# Patient Record
Sex: Female | Born: 1935 | Race: Black or African American | Hispanic: No | Smoking: Former smoker
Health system: Southern US, Community
[De-identification: ages and names within clinical notes are randomized; demographics above are authoritative.]

## PROBLEM LIST (undated history)

## (undated) DIAGNOSIS — K219 Gastro-esophageal reflux disease without esophagitis: Secondary | ICD-10-CM

## (undated) DIAGNOSIS — K5790 Diverticulosis of intestine, part unspecified, without perforation or abscess without bleeding: Secondary | ICD-10-CM

## (undated) DIAGNOSIS — F0390 Unspecified dementia without behavioral disturbance: Secondary | ICD-10-CM

## (undated) DIAGNOSIS — K2970 Gastritis, unspecified, without bleeding: Secondary | ICD-10-CM

## (undated) DIAGNOSIS — K635 Polyp of colon: Secondary | ICD-10-CM

## (undated) DIAGNOSIS — I509 Heart failure, unspecified: Secondary | ICD-10-CM

## (undated) DIAGNOSIS — I10 Essential (primary) hypertension: Secondary | ICD-10-CM

## (undated) DIAGNOSIS — I2510 Atherosclerotic heart disease of native coronary artery without angina pectoris: Secondary | ICD-10-CM

## (undated) DIAGNOSIS — E039 Hypothyroidism, unspecified: Secondary | ICD-10-CM

## (undated) DIAGNOSIS — E785 Hyperlipidemia, unspecified: Secondary | ICD-10-CM

## (undated) DIAGNOSIS — E538 Deficiency of other specified B group vitamins: Secondary | ICD-10-CM

## (undated) DIAGNOSIS — K3184 Gastroparesis: Secondary | ICD-10-CM

## (undated) HISTORY — DX: Gastritis, unspecified, without bleeding: K29.70

## (undated) HISTORY — PX: PACEMAKER PLACEMENT: SHX43

## (undated) HISTORY — DX: Diverticulosis of intestine, part unspecified, without perforation or abscess without bleeding: K57.90

## (undated) HISTORY — DX: Atherosclerotic heart disease of native coronary artery without angina pectoris: I25.10

## (undated) HISTORY — DX: Hyperlipidemia, unspecified: E78.5

## (undated) HISTORY — DX: Gastroparesis: K31.84

## (undated) HISTORY — DX: Unspecified dementia, unspecified severity, without behavioral disturbance, psychotic disturbance, mood disturbance, and anxiety: F03.90

## (undated) HISTORY — DX: Gastro-esophageal reflux disease without esophagitis: K21.9

## (undated) HISTORY — DX: Polyp of colon: K63.5

## (undated) HISTORY — DX: Deficiency of other specified B group vitamins: E53.8

## (undated) HISTORY — DX: Hypothyroidism, unspecified: E03.9

## (undated) HISTORY — PX: HIP SURGERY: SHX245

---

## 1998-09-16 ENCOUNTER — Inpatient Hospital Stay (HOSPITAL_COMMUNITY): Admission: EM | Admit: 1998-09-16 | Discharge: 1998-09-18 | Payer: Self-pay | Admitting: Emergency Medicine

## 1998-09-16 ENCOUNTER — Encounter: Payer: Self-pay | Admitting: Emergency Medicine

## 1999-09-30 ENCOUNTER — Ambulatory Visit (HOSPITAL_COMMUNITY): Admission: RE | Admit: 1999-09-30 | Discharge: 1999-09-30 | Payer: Self-pay | Admitting: *Deleted

## 1999-11-22 ENCOUNTER — Other Ambulatory Visit: Admission: RE | Admit: 1999-11-22 | Discharge: 1999-11-22 | Payer: Self-pay | Admitting: *Deleted

## 2000-01-26 ENCOUNTER — Encounter: Payer: Self-pay | Admitting: Cardiology

## 2000-01-26 ENCOUNTER — Ambulatory Visit (HOSPITAL_COMMUNITY): Admission: RE | Admit: 2000-01-26 | Discharge: 2000-01-26 | Payer: Self-pay | Admitting: Cardiology

## 2000-02-03 ENCOUNTER — Emergency Department (HOSPITAL_COMMUNITY): Admission: EM | Admit: 2000-02-03 | Discharge: 2000-02-03 | Payer: Self-pay | Admitting: Emergency Medicine

## 2000-02-03 ENCOUNTER — Encounter: Payer: Self-pay | Admitting: Emergency Medicine

## 2000-02-26 ENCOUNTER — Emergency Department (HOSPITAL_COMMUNITY): Admission: EM | Admit: 2000-02-26 | Discharge: 2000-02-26 | Payer: Self-pay | Admitting: Emergency Medicine

## 2000-02-26 ENCOUNTER — Encounter: Payer: Self-pay | Admitting: Emergency Medicine

## 2000-03-13 ENCOUNTER — Emergency Department (HOSPITAL_COMMUNITY): Admission: EM | Admit: 2000-03-13 | Discharge: 2000-03-13 | Payer: Self-pay | Admitting: Emergency Medicine

## 2000-03-13 ENCOUNTER — Encounter: Payer: Self-pay | Admitting: Emergency Medicine

## 2000-06-12 ENCOUNTER — Encounter: Admission: RE | Admit: 2000-06-12 | Discharge: 2000-09-10 | Payer: Self-pay | Admitting: *Deleted

## 2001-06-13 ENCOUNTER — Encounter: Payer: Self-pay | Admitting: Family Medicine

## 2001-06-13 ENCOUNTER — Ambulatory Visit (HOSPITAL_COMMUNITY): Admission: RE | Admit: 2001-06-13 | Discharge: 2001-06-13 | Payer: Self-pay | Admitting: Family Medicine

## 2002-02-18 ENCOUNTER — Encounter: Payer: Self-pay | Admitting: Internal Medicine

## 2002-02-18 ENCOUNTER — Ambulatory Visit (HOSPITAL_COMMUNITY): Admission: RE | Admit: 2002-02-18 | Discharge: 2002-02-18 | Payer: Self-pay | Admitting: Internal Medicine

## 2002-04-08 ENCOUNTER — Encounter: Payer: Self-pay | Admitting: Internal Medicine

## 2002-04-08 ENCOUNTER — Ambulatory Visit (HOSPITAL_COMMUNITY): Admission: RE | Admit: 2002-04-08 | Discharge: 2002-04-08 | Payer: Self-pay | Admitting: Internal Medicine

## 2002-07-24 ENCOUNTER — Encounter (INDEPENDENT_AMBULATORY_CARE_PROVIDER_SITE_OTHER): Payer: Self-pay | Admitting: Cardiology

## 2002-07-24 ENCOUNTER — Inpatient Hospital Stay (HOSPITAL_COMMUNITY): Admission: EM | Admit: 2002-07-24 | Discharge: 2002-07-27 | Payer: Self-pay | Admitting: Emergency Medicine

## 2002-07-24 ENCOUNTER — Encounter: Payer: Self-pay | Admitting: Cardiology

## 2002-09-17 ENCOUNTER — Encounter: Payer: Self-pay | Admitting: Cardiology

## 2002-09-17 ENCOUNTER — Encounter: Admission: RE | Admit: 2002-09-17 | Discharge: 2002-09-17 | Payer: Self-pay | Admitting: Cardiology

## 2002-09-23 ENCOUNTER — Ambulatory Visit (HOSPITAL_COMMUNITY): Admission: RE | Admit: 2002-09-23 | Discharge: 2002-09-24 | Payer: Self-pay | Admitting: Cardiology

## 2003-05-07 ENCOUNTER — Encounter: Payer: Self-pay | Admitting: Internal Medicine

## 2003-05-07 ENCOUNTER — Ambulatory Visit (HOSPITAL_COMMUNITY): Admission: RE | Admit: 2003-05-07 | Discharge: 2003-05-07 | Payer: Self-pay | Admitting: Internal Medicine

## 2003-05-21 ENCOUNTER — Ambulatory Visit: Admission: RE | Admit: 2003-05-21 | Discharge: 2003-05-21 | Payer: Self-pay | Admitting: Internal Medicine

## 2003-07-18 ENCOUNTER — Encounter: Payer: Self-pay | Admitting: Emergency Medicine

## 2003-07-18 ENCOUNTER — Inpatient Hospital Stay (HOSPITAL_COMMUNITY): Admission: EM | Admit: 2003-07-18 | Discharge: 2003-07-22 | Payer: Self-pay | Admitting: Emergency Medicine

## 2003-07-19 ENCOUNTER — Encounter: Payer: Self-pay | Admitting: Emergency Medicine

## 2003-07-20 ENCOUNTER — Encounter: Payer: Self-pay | Admitting: Internal Medicine

## 2003-07-20 ENCOUNTER — Encounter: Payer: Self-pay | Admitting: Cardiology

## 2004-04-10 ENCOUNTER — Emergency Department (HOSPITAL_COMMUNITY): Admission: EM | Admit: 2004-04-10 | Discharge: 2004-04-11 | Payer: Self-pay | Admitting: Emergency Medicine

## 2004-06-08 ENCOUNTER — Ambulatory Visit (HOSPITAL_COMMUNITY): Admission: RE | Admit: 2004-06-08 | Discharge: 2004-06-08 | Payer: Self-pay | Admitting: *Deleted

## 2004-06-08 ENCOUNTER — Encounter (INDEPENDENT_AMBULATORY_CARE_PROVIDER_SITE_OTHER): Payer: Self-pay | Admitting: Specialist

## 2004-06-08 ENCOUNTER — Encounter (INDEPENDENT_AMBULATORY_CARE_PROVIDER_SITE_OTHER): Payer: Self-pay | Admitting: *Deleted

## 2005-03-16 ENCOUNTER — Encounter: Admission: RE | Admit: 2005-03-16 | Discharge: 2005-03-16 | Payer: Self-pay | Admitting: Cardiology

## 2005-08-03 ENCOUNTER — Encounter (INDEPENDENT_AMBULATORY_CARE_PROVIDER_SITE_OTHER): Payer: Self-pay | Admitting: Specialist

## 2005-08-03 ENCOUNTER — Ambulatory Visit (HOSPITAL_COMMUNITY): Admission: RE | Admit: 2005-08-03 | Discharge: 2005-08-03 | Payer: Self-pay | Admitting: Gastroenterology

## 2005-08-08 ENCOUNTER — Inpatient Hospital Stay (HOSPITAL_COMMUNITY): Admission: EM | Admit: 2005-08-08 | Discharge: 2005-08-18 | Payer: Self-pay | Admitting: Emergency Medicine

## 2005-08-17 ENCOUNTER — Encounter (INDEPENDENT_AMBULATORY_CARE_PROVIDER_SITE_OTHER): Payer: Self-pay | Admitting: Cardiology

## 2007-01-02 ENCOUNTER — Inpatient Hospital Stay (HOSPITAL_COMMUNITY): Admission: EM | Admit: 2007-01-02 | Discharge: 2007-01-05 | Payer: Self-pay | Admitting: Emergency Medicine

## 2008-01-09 ENCOUNTER — Ambulatory Visit (HOSPITAL_COMMUNITY): Admission: RE | Admit: 2008-01-09 | Discharge: 2008-01-09 | Payer: Self-pay | Admitting: Internal Medicine

## 2008-12-21 ENCOUNTER — Emergency Department (HOSPITAL_COMMUNITY): Admission: EM | Admit: 2008-12-21 | Discharge: 2008-12-21 | Payer: Self-pay | Admitting: Emergency Medicine

## 2009-03-16 ENCOUNTER — Inpatient Hospital Stay (HOSPITAL_COMMUNITY): Admission: AD | Admit: 2009-03-16 | Discharge: 2009-03-18 | Payer: Self-pay | Admitting: Cardiology

## 2009-03-17 ENCOUNTER — Encounter (INDEPENDENT_AMBULATORY_CARE_PROVIDER_SITE_OTHER): Payer: Self-pay | Admitting: Cardiology

## 2009-03-21 ENCOUNTER — Inpatient Hospital Stay (HOSPITAL_COMMUNITY): Admission: EM | Admit: 2009-03-21 | Discharge: 2009-03-24 | Payer: Self-pay | Admitting: Emergency Medicine

## 2009-09-06 ENCOUNTER — Observation Stay (HOSPITAL_COMMUNITY): Admission: EM | Admit: 2009-09-06 | Discharge: 2009-09-08 | Payer: Self-pay | Admitting: Emergency Medicine

## 2009-09-06 ENCOUNTER — Ambulatory Visit: Payer: Self-pay | Admitting: Infectious Diseases

## 2009-09-21 ENCOUNTER — Encounter: Admission: RE | Admit: 2009-09-21 | Discharge: 2009-09-21 | Payer: Self-pay | Admitting: Internal Medicine

## 2009-09-25 ENCOUNTER — Observation Stay (HOSPITAL_COMMUNITY): Admission: EM | Admit: 2009-09-25 | Discharge: 2009-09-25 | Payer: Self-pay | Admitting: Emergency Medicine

## 2009-11-16 ENCOUNTER — Observation Stay (HOSPITAL_COMMUNITY): Admission: EM | Admit: 2009-11-16 | Discharge: 2009-11-25 | Payer: Self-pay | Admitting: Emergency Medicine

## 2009-12-25 DIAGNOSIS — K635 Polyp of colon: Secondary | ICD-10-CM

## 2009-12-25 DIAGNOSIS — K2970 Gastritis, unspecified, without bleeding: Secondary | ICD-10-CM

## 2009-12-25 DIAGNOSIS — K5790 Diverticulosis of intestine, part unspecified, without perforation or abscess without bleeding: Secondary | ICD-10-CM

## 2009-12-25 HISTORY — DX: Polyp of colon: K63.5

## 2009-12-25 HISTORY — DX: Gastritis, unspecified, without bleeding: K29.70

## 2009-12-25 HISTORY — DX: Diverticulosis of intestine, part unspecified, without perforation or abscess without bleeding: K57.90

## 2010-01-27 ENCOUNTER — Encounter: Payer: Self-pay | Admitting: Gastroenterology

## 2010-01-28 ENCOUNTER — Encounter (INDEPENDENT_AMBULATORY_CARE_PROVIDER_SITE_OTHER): Payer: Self-pay | Admitting: *Deleted

## 2010-03-01 ENCOUNTER — Ambulatory Visit: Payer: Self-pay | Admitting: Gastroenterology

## 2010-03-01 DIAGNOSIS — Z860100 Personal history of colon polyps, unspecified: Secondary | ICD-10-CM | POA: Insufficient documentation

## 2010-03-01 DIAGNOSIS — E119 Type 2 diabetes mellitus without complications: Secondary | ICD-10-CM | POA: Insufficient documentation

## 2010-03-01 DIAGNOSIS — R1013 Epigastric pain: Secondary | ICD-10-CM

## 2010-03-01 DIAGNOSIS — I2510 Atherosclerotic heart disease of native coronary artery without angina pectoris: Secondary | ICD-10-CM | POA: Insufficient documentation

## 2010-03-07 ENCOUNTER — Ambulatory Visit: Payer: Self-pay | Admitting: Gastroenterology

## 2010-03-08 ENCOUNTER — Telehealth: Payer: Self-pay | Admitting: Gastroenterology

## 2010-03-09 ENCOUNTER — Encounter: Payer: Self-pay | Admitting: Gastroenterology

## 2010-03-17 ENCOUNTER — Ambulatory Visit (HOSPITAL_COMMUNITY): Admission: RE | Admit: 2010-03-17 | Discharge: 2010-03-17 | Payer: Self-pay | Admitting: Gastroenterology

## 2010-03-30 ENCOUNTER — Encounter: Payer: Self-pay | Admitting: Internal Medicine

## 2010-03-30 ENCOUNTER — Ambulatory Visit: Payer: Self-pay | Admitting: Gastroenterology

## 2010-03-30 DIAGNOSIS — K3184 Gastroparesis: Secondary | ICD-10-CM

## 2010-03-30 DIAGNOSIS — E538 Deficiency of other specified B group vitamins: Secondary | ICD-10-CM

## 2010-04-08 ENCOUNTER — Ambulatory Visit: Payer: Self-pay | Admitting: Gastroenterology

## 2010-04-18 ENCOUNTER — Encounter: Payer: Self-pay | Admitting: Gastroenterology

## 2010-08-31 ENCOUNTER — Ambulatory Visit (HOSPITAL_COMMUNITY): Admission: RE | Admit: 2010-08-31 | Discharge: 2010-08-31 | Payer: Self-pay | Admitting: Internal Medicine

## 2010-10-03 ENCOUNTER — Emergency Department (HOSPITAL_COMMUNITY): Admission: EM | Admit: 2010-10-03 | Discharge: 2010-10-03 | Payer: Self-pay | Admitting: Emergency Medicine

## 2011-01-26 NOTE — Letter (Signed)
Summary: New Patient letter  [facility]  [address] [place], [place] [address]   Phone: [phone]  Fax: [phone]       01/28/2010 MRN: [id number]  [facility] [address] [place], [place]  [address]  Dear Ms. Chill,  Welcome to the Gastroenterology Division at [facility].    You are scheduled to see Dr.  Melvia Heaps on February 21, 2010 at 10:45am on the 3rd floor at [facility], [address]  We ask that you try to arrive at our office 15 minutes prior to your appointment time to allow for check-in.  We would like you to complete the enclosed self-administered evaluation form prior to your visit and bring it with you on the day of your appointment.  We will review it with you.  Also, please bring a complete list of all your medications or, if you prefer, bring the medication bottles and we will list them.  Please bring your insurance card so that we may make a copy of it.  If your insurance requires a referral to see a specialist, please bring your referral form from your primary care physician.  Co-payments are due at the time of your visit and may be paid by cash, check or credit card.     Your office visit will consist of a consult with your physician (includes a physical exam), any laboratory testing he/she may order, scheduling of any necessary diagnostic testing (e.g. x-ray, ultrasound, CT-scan), and scheduling of a procedure (e.g. Endoscopy, Colonoscopy) if required.  Please allow enough time on your schedule to allow for any/all of these possibilities.    If you cannot keep your appointment, please call [phone] to cancel or reschedule prior to your appointment date.  This allows [place] the opportunity to schedule an appointment for another patient in need of care.  If you do not cancel or reschedule by 5 p.m. the business day prior to your appointment date, you will be charged a $50.00 late cancellation/no-show fee.     Thank you for choosing [facility] for your medical needs.  We appreciate the opportunity to care for you.  Please visit [place] at our website  to learn more about our practice.                     Sincerely,                                                             The Gastroenterology Division

## 2011-01-26 NOTE — Procedures (Signed)
Summary: colonoscopy Roosvelt Harps)   Colonoscopy  Procedure date:  06/08/2004  Findings:      Results: Polyp.  Location:  [facility].    Colonoscopy  Procedure date:  06/08/2004  Findings:      Results: Polyp.  Location:  [facility].   Patient Name: [name], [name] MRN:  Procedure Procedures: Colonoscopy CPT: [phone].    with biopsy. CPT: Q5068410.  Personnel: Endoscopist: Roosvelt Harps, MD.  Referred By: Fleet Contras, MD.  Exam Location: Exam performed in Endoscopy Suite. Outpatient  Patient Consent: Procedure, Alternatives, Risks and Benefits discussed, consent obtained, from patient. Consent was obtained by the RN. Consent to be contacted was not given.  Indications Symptoms: Constipation Melena (unknown source). Abdominal pain / bloating.  Average Risk Screening Routine.  History  Current Medications: Patient is not currently taking Coumadin.  Allergies: Allergic to PCN.  Patient Habits Patient does not smoke. Drinking Status: not currently drinking.  Pre-Exam Physical: Cardio-pulmonary exam, Rectal exam, HEENT exam , Abdominal exam, Extremity exam, Neurological exam, Mental status exam WNL.  Exam Exam: Extent of exam reached: Cecum, extent intended: Cecum.  The cecum was identified by appendiceal orifice and IC valve. Patient position: on left side. Images taken. ASA Classification: II. Tolerance: excellent.  Monitoring: Pulse and BP monitoring, Oximetry used. Supplemental O2 given.  Colon Prep Used Phospho Soda for colon prep. Prep results: excellent.  Fluoroscopy: Fluoroscopy was not used.  Sedation Meds: Patient assessed and found to be appropriate for moderate (conscious) sedation. Sedation was managed by the Endoscopist. Residual sedation present from prior procedure today.  Demerol 10 mg. given IV. Versed 1 mg. given IV.  Findings IMAGE TAKEN: Transverse Colon.  Image #2 attached.  Comments:  Normal.  IMAGE TAKEN:  Descending Colon.  Image #3 attached.  Comments:  Normal.  IMAGE TAKEN: Ascending Colon.  Image #1 attached.  Comments:  Normal.  NORMAL EXAM: Cecum.  POLYP: Rectum, diminutive, Distance from Anus 2 cm. Procedure:  biopsy without cautery, The polyp was removed piece meal. removed, retrieved, Polyp sent to pathology. ICD9: Colon Polyps: 211.3. Comments: Image # 4.   Assessment Abnormal examination, see findings above.  Diagnoses: 211.3: Colon Polyps.   Events  Unplanned Interventions: No intervention was required.  Unplanned Events: There were no complications. Plans  Post Exam Instructions: Post sedation instructions given.  Medication Plan: Continue current medications.  Patient Education: Patient given standard instructions for: Polyps.  Disposition: After procedure patient sent to recovery. After recovery patient sent home.  Scheduling/Referral: Follow-Up prn. Await pathology to schedule patient.   This report was created from the original endoscopy report, which was reviewed and signed by the above listed endoscopist.

## 2011-01-26 NOTE — Assessment & Plan Note (Signed)
Summary: nausea, abd pain...em   History of Present Illness Visit Type: Initial Consult Primary GI MD: Melvia Heaps MD [facility] Primary Provider: Marisue Brooklyn, DO Chief Complaint: Patient c/o generalized abdominal discomfort since she had a fall at Thanksgiving. She also c/o chest discomfort as well as nausea, moreso in the mornings. She notes that she has seen some brb when she "spits up." Patient has nausea but denies any actual vomiting. She has had some loss of appetite and initially had a weight loss of 20 pounds, although she is now back up to 180 pounds. History of Present Illness:   [name] is an 77 year old African American female with a history of coronary artery disease, chronic kidney disease, hypertension and diabetes referred at the request of  Marisue Brooklyn, DO for evaluation of nausea and upper abdominal pain.  The patient is a very limited historian.  She complains of vague upper abdominal pain that occurs either during eating or immediately postprandially.  It is associated with nausea but no vomiting.  She takes Zantac regularly.  She is on no gastric irritants or nonsteroidals.  She may have mild dysphagia solids.  She denies pyrosis, per se.  She initially lost 20 pounds beginning September, 2010 but regained her weight.  She does claim that she occasionally spits up some blood.  In 2005 patient underwent colonoscopy which demonstrated a diminutive adenomatous rectal polyp.  The patient is on Plavix.   GI Review of Systems    Reports abdominal pain, acid reflux, chest pain, loss of appetite, nausea, and  weight loss.     Location of  Abdominal pain: generalized.    Denies belching, bloating, dysphagia with liquids, dysphagia with solids, heartburn, vomiting, vomiting blood, and  weight gain.      Reports constipation.     Denies anal fissure, black tarry stools, change in bowel habit, diarrhea, diverticulosis, fecal incontinence, heme positive stool, hemorrhoids, irritable  bowel syndrome, jaundice, light color stool, liver problems, rectal bleeding, and  rectal pain. Preventive Screening-Counseling & Management  Alcohol-Tobacco     Smoking Status: quit      Drug Use:  no.      Current Medications (verified): 1)  Aspirin 81 Mg Tbec (Aspirin) .Marland Kitchen.. 1 By Mouth Once Daily 2)  Plavix 75 Mg Tabs (Clopidogrel Bisulfate) .Marland Kitchen.. 1 By Mouth Once Daily 3)  Famotidine 20 Mg Tabs (Famotidine) .Marland Kitchen.. 1 By Mouth Two Times A Day 4)  Lasix 40 Mg Tabs (Furosemide) .Marland Kitchen.. 1 By Mouth Two Times A Day 5)  Synthroid 125 Mcg Tabs (Levothyroxine Sodium) .Marland Kitchen.. 1 By Mouth Once Daily 6)  Simvastatin 20 Mg Tabs (Simvastatin) .Marland Kitchen.. 1 By Mouth Once Daily 7)  Detrol La 4 Mg Xr24h-Cap (Tolterodine Tartrate) .Marland Kitchen.. 1 By Mouth Once Daily 8)  Zantac 150 Mg Tabs (Ranitidine Hcl) .Marland Kitchen.. 1 By Mouth Two Times A Day 9)  Atenolol 50 Mg Tabs (Atenolol) .Marland Kitchen.. 1 By Mouth Once Daily 10)  Vitamin D 2000 Unit Tabs (Cholecalciferol) .Marland Kitchen.. 1 By Mouth Once Daily 11)  Zantac 150 Mg Tabs (Ranitidine Hcl) .Marland Kitchen.. 1 By Mouth Once Daily 12)  Glipizide 5 Mg Tabs (Glipizide) .Marland Kitchen.. 1 By Mouth Two Times A Day 13)  Diltiazem Hcl Coated Beads 360 Mg Xr24h-Tab (Diltiazem Hcl Coated Beads) .Marland Kitchen.. 1 By Mouth Once Daily 14)  Multivitamins  Tabs (Multiple Vitamin) .... Take 2 Tablets By Mouth Once Daily  Allergies (verified): 1)  ! Pcn  Past History:  Past Medical History: Reviewed history from 02/24/2010 and  no changes required. Chronic Kidney Disease Hypothyroidism Hypertension Diabetes Hyperlipidemia GERD hx of heart blockage osteoarthritis urinary incontinence  Past Surgical History: pacemaker placement  Family History: No FH of Colon Cancer: Family History of Diabetes: Brothers x 2, Sister Family History of Heart Disease: Father  Social History: Patient is a former smoker. -only smoked a few years...stopped at age 73 Alcohol Use - no Daily Caffeine Use-1-2 per week Illicit Drug Use - no Smoking Status:   quit Drug Use:  no  Review of Systems       The patient complains of headaches-new.  The patient denies allergy/sinus, anemia, anxiety-new, arthritis/joint pain, back pain, blood in urine, breast changes/lumps, change in vision, confusion, cough, coughing up blood, depression-new, fainting, fatigue, fever, hearing problems, heart murmur, heart rhythm changes, itching, menstrual pain, muscle pains/cramps, night sweats, nosebleeds, pregnancy symptoms, shortness of breath, skin rash, sleeping problems, sore throat, swelling of feet/legs, swollen lymph glands, thirst - excessive , urination - excessive , urination changes/pain, urine leakage, vision changes, and voice change.         All other systems were reviewed and were negative   Vital Signs:  Patient profile:   77 year old female Height:      71 inches Weight:      180 pounds BMI:     25.20 BSA:     2.02 Pulse rate:   60 / minute Pulse rhythm:   regular BP sitting:   136 / 80  (left arm)  Vitals Entered By: Hortense Ramal CMA Duncan Dull) (March 01, 2010 1:20 PM)  Physical Exam  Additional Exam:  She is an alert female in no acute distress  skin: anicteric HEENT: normocephalic; PEERLA; no nasal or pharyngeal abnormalities neck: supple nodes: no cervical lymphadenopathy chest: clear to ausculatation and percussion heart: no gallops or rubs; there is a 2/6 early systolic murmur heard best at the left sternal border abd: soft, nontender; BS normoactive; no abdominal masses,  organomegaly; there is mild tenderness to the patient in the upper midepigastrium and right upper quadrant in the subcostal area rectal: deferred ext: no cynanosis, clubbing; she has 4+ pedal edema edema skeletal: no deformities neuro: oriented x 3; no focal abnormalities    Impression & Recommendations:  Problem # 1:  ABDOMINAL PAIN, EPIGASTRIC (ICD-789.06)  Symptoms of pain and nausea it could be due to ulcer or nonulcer dyspepsia.  Gastroparesis is also a  consideration.  Recommendations #1 upper endoscopy #2 to consider gastric emptying scan pending results of #1  Risks, alternatives, and complications of the procedure, including bleeding, perforation, and possible need for surgery, were explained to the patient.  Patient's questions were answered.  Orders: EGD (EGD)  Problem # 2:  PERSONAL HISTORY OF COLONIC POLYPS (ICD-V12.72) Plan followup colonoscopy when patient's abdominal pain is resolved  Problem # 3:  CAD (ICD-414.00) Patient is on Plavix  Problem # 4:  DM (ICD-250.00) Assessment: Comment Only  Patient Instructions: 1)  Conscious Sedation brochure given.  2)  Upper Endoscopy brochure given.  3)  Your EGD is scheduled for 03/07/2010 at 2pm on the 4th floor of the [facility] building 4)  Stay on Plavix for your procedure 5)  CC Dr. Marisue Brooklyn 6)  The medication list was reviewed and reconciled.  All changed / newly prescribed medications were explained.  A complete medication list was provided to the patient / caregiver.

## 2011-01-26 NOTE — Letter (Signed)
Summary: Results Letter  [facility]  [address] [place], [place] [address]   Phone: [phone]  Fax: [phone]        March 09, 2010 MRN: [id number]    [facility] [address] [facility] [place], [place]  [address]    Dear Ms. Teagle,   Your biopsies demonstrated inflammatory changes only.    Please follow the recommendations previously discussed.  Should you have any immediate concerns or questions, feel free to contact me at the office.    Sincerely,  Barbette Hair. Arlyce Dice, [name], [facility]          Sincerely,  Louis Meckel MD  This letter has been electronically signed by your physician.  Appended Document: Results Letter Letter mailed 3.17.11

## 2011-01-26 NOTE — Letter (Signed)
Summary: [facility] Instructions  [facility]  [address] [place], [place] [address]   Phone: [phone]  Fax: [phone]       [name]    10/25/1925    MRN: [id number]        Procedure Day /Date:THURSDAY, 04/07/10     Arrival Time:7:30 AM     Procedure Time:8:30 AM     Location of Procedure:                    [facility] (4th Floor)   PREPARATION FOR COLONOSCOPY WITH MOVIPREP   Starting 5 days prior to your procedure 04/03/10 do not eat nuts, seeds, popcorn, corn, beans, peas,  salads, or any raw vegetables.  Do not take any fiber supplements (e.g. Metamucil, Citrucel, and Benefiber).  THE DAY BEFORE YOUR PROCEDURE         DATE: 04/06/10 DAY: WEDNESDAY  1.  Drink clear liquids the entire day-NO SOLID FOOD  2.  Do not drink anything colored red or purple.  Avoid juices with pulp.  No orange juice.  3.  Drink at least 64 oz. (8 glasses) of fluid/clear liquids during the day to prevent dehydration and help the prep work efficiently.  CLEAR LIQUIDS INCLUDE: Water Jello Ice Popsicles Tea (sugar ok, no milk/cream) Powdered fruit flavored drinks Coffee (sugar ok, no milk/cream) Gatorade Juice: apple, white grape, white cranberry  Lemonade Clear bullion, consomm, broth Carbonated beverages (any kind) Strained chicken noodle soup Hard Candy                             4.  In the morning, mix first dose of MoviPrep solution:    Empty 1 Pouch A and 1 Pouch B into the disposable container    Add lukewarm drinking water to the top line of the container. Mix to dissolve    Refrigerate (mixed solution should be used within 24 hrs)  5.  Begin drinking the prep at 5:00 p.m. The MoviPrep container is divided by 4 marks.   Every 15 minutes drink the solution down to the next mark (approximately 8 oz) until the full liter is complete.   6.  Follow completed prep with 16 oz of clear liquid of your choice (Nothing red or purple).   Continue to drink clear liquids until bedtime.  7.  Before going to bed, mix second dose of MoviPrep solution:    Empty 1 Pouch A and 1 Pouch B into the disposable container    Add lukewarm drinking water to the top line of the container. Mix to dissolve    Refrigerate  THE DAY OF YOUR PROCEDURE      DATE: 04/07/10 DAY: THURSDAY  Beginning at 3:30 a.m. (5 hours before procedure):         1. Every 15 minutes, drink the solution down to the next mark (approx 8 oz) until the full liter is complete.  2. Follow completed prep with 16 oz. of clear liquid of your choice.    3. You may drink clear liquids until6:30 AM (2 HOURS BEFORE PROCEDURE).   MEDICATION INSTRUCTIONS  Unless otherwise instructed, you should take regular prescription medications with a small sip of water   as early as possible the morning of your procedure.  Diabetic patients - see separate instructions.  Stop taking Plavix  ON 03/31/10 (7 days before procedure).  OTHER INSTRUCTIONS  You will need a responsible adult at least 75 years of age to accompany you and drive you home.   This person must remain in the waiting room during your procedure.  Wear loose fitting clothing that is easily removed.  Leave jewelry and other valuables at home.  However, you may wish to bring a book to read or  an iPod/MP3 player to listen to music as you wait for your procedure to start.  Remove all body piercing jewelry and leave at home.  Total time from sign-in until discharge is approximately 2-3 hours.  You should go home directly after your procedure and rest.  You can resume normal activities the  day after your procedure.  The day of your procedure you should not:   Drive   Make legal decisions   Operate machinery   Drink alcohol   Return to work  You will receive specific instructions about eating, activities and medications before you leave.    The above instructions have been reviewed and  explained to me by   _______________________    I fully understand and can verbalize these instructions _____________________________ Date _________  Appended Document: Moviprep Instructions Pt.'s daughter called and changed appt. to 04/08/10 at 3:30 pm.  She was given the new times and dates with instructions for 04/08/10.  She understands and will call if any further questions.

## 2011-01-26 NOTE — Letter (Signed)
Summary: EGD Instructions  [facility]  [address] [place], [place] [address]   Phone: [phone]  Fax: [phone]       VANA ARIF    Dec 15, 1925    MRN: [id number]       Procedure Day /Date:MONDAY 03/07/2010     Arrival Time:1PM     Procedure Time:2PM     Location of Procedure:                    X  [facility] (4th Floor)  PREPARATION FOR ENDOSCOPY   On 03/07/2010  THE DAY OF THE PROCEDURE:  1.   No solid foods, milk or milk products are allowed after midnight the night before your procedure.  2.   Do not drink anything colored red or purple.  Avoid juices with pulp.  No orange juice.  3.  You may drink clear liquids until 12PM  which is 2 hours before your procedure.                                                                                                CLEAR LIQUIDS INCLUDE: Water Jello Ice Popsicles Tea (sugar ok, no milk/cream) Powdered fruit flavored drinks Coffee (sugar ok, no milk/cream) Gatorade Juice: apple, white grape, white cranberry  Lemonade Clear bullion, consomm, broth Carbonated beverages (any kind) Strained chicken noodle soup Hard Candy   MEDICATION INSTRUCTIONS  Unless otherwise instructed, you should take regular prescription medications with a small sip of water as early as possible the morning of your procedure.  Diabetic patients - see separate instructions.  Stop taking Plavix    STAY ON PLAVIX PER DR Sabrine Patchen            OTHER INSTRUCTIONS  You will need a responsible adult at least 75 years of age to accompany you and drive you home.   This person must remain in the waiting room during your procedure.  Wear loose fitting clothing that is easily removed.  Leave jewelry and other valuables at home.  However, you may wish to bring a book to read or an iPod/MP3 player to listen to music as you wait for your procedure to start.  Remove all body piercing jewelry and leave at  home.  Total time from sign-in until discharge is approximately 2-3 hours.  You should go home directly after your procedure and rest.  You can resume normal activities the day after your procedure.  The day of your procedure you should not:   Drive   Make legal decisions   Operate machinery   Drink alcohol   Return to work  You will receive specific instructions about eating, activities and medications before you leave.    The above instructions have been reviewed and explained to me by   _______________________    I fully understand and can verbalize these instructions _____________________________ Date _________

## 2011-01-26 NOTE — Letter (Signed)
Summary: Diabetic Instructions  [facility]  [address] [place], [place] [address]   Phone: [phone]  Fax: [phone]    [name] 05/20/1925 MRN: [id number]   X  ORAL DIABETIC MEDICATION INSTRUCTIONS  The day before your procedure:   Take your diabetic pill as you do normally  The day of your procedure:   Do not take your diabetic pill    We will check your blood sugar levels during the admission process and again in Recovery before discharging you home  ________________________________________________________________________  _  _   INSULIN (LONG ACTING) MEDICATION INSTRUCTIONS (Lantus, NPH, 70/30, Humulin, Novolin-N)   The day before your procedure:   Take  your regular evening dose    The day of your procedure:   Do not take your morning dose    _  _   INSULIN (SHORT ACTING) MEDICATION INSTRUCTIONS (Regular, Humulog, Novolog)   The day before your procedure:   Do not take your evening dose   The day of your procedure:   Do not take your morning dose   _  _   INSULIN PUMP MEDICATION INSTRUCTIONS  We will contact the physician managing your diabetic care for written dosage instructions for the day before your procedure and the day of your procedure.  Once we have received the instructions, we will contact you.

## 2011-01-26 NOTE — Procedures (Signed)
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    with biopsy(s)/brushing(s). CPT: D1846139.  Personnel: Endoscopist: Roosvelt Harps, MD.  Referred By: Fleet Contras, MD.  Exam Location: Exam performed in Endoscopy Suite. Outpatient  Patient Consent: Procedure, Alternatives, Risks and Benefits discussed, consent obtained, from patient. Consent was obtained by the RN. Consent to be contacted was not given.  Indications Symptoms: Dysphagia. Abdominal pain, location: diffuse. Melena.  History  Current Medications: Patient is not currently taking Coumadin.  Allergies: Patient is allergic to PCN.  Patient Habits Patient does not smoke. Drinking Status: not currently drinking.  Pre-Exam Physical: Cardio-pulmonary exam, HEENT exam, Abdominal exam, Extremity exam, Neurological exam, Mental status exam WNL.  Exam Exam Info: Maximum depth of insertion Stomach, intended Duodenum. Patient position: left side to back. Vocal cords not visualized. Gastric retroflexion performed. Images taken. ASA Classification: II. Tolerance: good.  Sedation Meds: Patient assessed and found to be appropriate for moderate (conscious) sedation. Sedation was managed by the Endoscopist. Cetacaine Spray 2 sprays given aerosolized. Demerol 50 mg. given IV. Versed 5 mg. given IV.  Monitoring: BP and pulse monitoring done. Oximetry used. Supplemental O2 given  Fluoroscopy: Fluoroscopy was not used.  Findings - Normal: Mid Esophagus to Distal Esophagus. Not Seen: Stricture.  IMAGE TAKEN: in Cardia. Image #4 attached. Comments: Normal.  IMAGE TAKEN: in Duodenal Bulb. Image #2 attached. Comments:  Normal.  POLYP: in Antrum. Maximum size: 6 mm. sessile polyp. Procedure: biopsy without cautery, Polyp sent to pathology. ICD9: Neoplasia, Benign, Stomach: 211.1. Comments: Image # 1.  DIAGNOSTIC TEST: from Antrum. RUT done, results pending  IMAGE TAKEN: in Duodenal 2nd Portion. Image #3 attached. Comments: Normal.   Assessment Abnormal examination, see findings above.  Diagnoses: 211.1: Neoplasia, Benign, Stomach.   Events  Unplanned Intervention: No unplanned interventions were required.  Unplanned Events: There were no complications. Plans Medication(s): Continue current medications.  Disposition: After procedure patient sent to recovery. After recovery patient sent home.  Scheduling: Await pathology to schedule patient. Follow-up prn.  This report was created from the original endoscopy report, which was reviewed and signed by the above listed endoscopist.

## 2011-01-26 NOTE — Procedures (Signed)
Summary: Upper Endoscopy  Patient: Nazia Rhines Note: All result statuses are Final unless otherwise noted.  Tests: (1) Upper Endoscopy (EGD)   EGD Upper Endoscopy       DONE     [facility]     [address]     [place], [place]  [address]           ENDOSCOPY PROCEDURE REPORT           PATIENT:  Britany, Callicott  MR#:  [id number]     BIRTHDATE:  [birth year], 84 yrs. old  GENDER:  female           ENDOSCOPIST:  Barbette Hair. Arlyce Dice, MD     Referred by:           PROCEDURE DATE:  03/07/2010     PROCEDURE:  EGD with biopsy     ASA CLASS:  Class III     INDICATIONS:  abdominal pain           MEDICATIONS:   Fentanyl 25 mcg IV, Versed 4 mg IV, glycopyrrolate     (Robinal) 0.2 mg IV, 0.6cc simethancone 0.6 cc PO     TOPICAL ANESTHETIC:  Exactacain Spray           DESCRIPTION OF PROCEDURE:   After the risks benefits and     alternatives of the procedure were thoroughly explained, informed     consent was obtained.  The LB GIF-H180 [id number] endoscope was     introduced through the mouth and advanced to the third portion of     the duodenum, without limitations.  The instrument was slowly     withdrawn as the mucosa was fully examined.     <<PROCEDUREIMAGES>>           Moderate gastritis was found. Moderate erythema in fundus, cardia     and body. Bxs taken (see image1 and image2).  Esophagitis was     found in the distal esophagus (see image3). Nonerosive esophagitis     lower 1/3  Retained food was present.  Otherwise the examination     was normal.    Retroflexed views revealed no abnormalities.    The     scope was then withdrawn from the patient and the procedure     completed.           COMPLICATIONS:  None           ENDOSCOPIC IMPRESSION:     1) Moderate gastritis     2) Esophagitis in the distal esophagus     3) Food, retained     4) Otherwise normal examination     RECOMMENDATIONS:     1) Nexium 40 mg; d/c zantac     2) Call office next 2-3 days to schedule  an office appointment     for 2 weeks     3) gastric emptying scan           REPEAT EXAM:  No           ______________________________     Barbette Hair. Arlyce Dice, MD           CC:  Marisue Brooklyn, DO           n.     eSIGNED:   Barbette Hair. Perian Tedder at 03/07/2010 02:27 PM           Clevinger, Claris Che, [phone]  Note: An exclamation mark (!) indicates a result that was not dispersed into  the flowsheet. Document Creation Date: 03/07/2010 2:28 PM _______________________________________________________________________  (1) Order result status: Final Collection or observation date-time: 03/07/2010 14:21 Requested date-time:  Receipt date-time:  Reported date-time:  Referring Physician:   Ordering Physician: Melvia Heaps [id number]) Specimen Source:  Source: Launa Grill Order Number: [id number] Lab site:

## 2011-01-26 NOTE — Miscellaneous (Signed)
  Clinical Lists Changes  Medications: Removed medication of ZANTAC 150 MG TABS (RANITIDINE HCL) 1 by mouth two times a day Removed medication of FAMOTIDINE 20 MG TABS (FAMOTIDINE) 1 by mouth two times a day Added new medication of NEXIUM 40 MG  CPDR (ESOMEPRAZOLE MAGNESIUM) 1 capsule each day 30 minutes before meal - Signed Rx of NEXIUM 40 MG  CPDR (ESOMEPRAZOLE MAGNESIUM) 1 capsule each day 30 minutes before meal;  #30 x 2;  Signed;  Entered by: Louis Meckel MD;  Authorized by: Louis Meckel MD;  Method used: Print then Give to Patient    Prescriptions: NEXIUM 40 MG  CPDR (ESOMEPRAZOLE MAGNESIUM) 1 capsule each day 30 minutes before meal  #30 x 2   Entered and Authorized by:   Louis Meckel MD   Signed by:   Louis Meckel MD on 03/07/2010   Method used:   Print then Give to Patient   RxID:   [id number]

## 2011-01-26 NOTE — Progress Notes (Signed)
Summary: GES/REV SCHEDULED  Phone Note Outgoing Call   Call placed by: Laureen Ochs LPN,  March 08, 2010 11:45 AM Summary of Call: Pt. is scheduled for a GES at [facility] on 3-24-11at 10am (NPO 8 hours prior) and an OV with Dr.Everest Hacking on 03-30-10 at 2:30pm. Appt. info/instructions given to pt. Katherina Mires at [phone]. French Ana will callback as needed. Initial call taken by: Laureen Ochs LPN,  March 08, 2010 11:52 AM

## 2011-01-26 NOTE — Procedures (Signed)
Summary: Colonoscopy  Patient: [name] Note: All result statuses are Final unless otherwise noted.  Tests: (1) Colonoscopy (COL)   COL Colonoscopy           DONE     [facility]     [address]     [place], [place]  [address]           COLONOSCOPY PROCEDURE REPORT           PATIENT:  [name], [name]  MR#:  [id number]     BIRTHDATE:  03/31/1925, 84 yrs. old  GENDER:  female           ENDOSCOPIST:  Barbette Hair. Arlyce Dice, MD     Referred by:           PROCEDURE DATE:  04/08/2010     PROCEDURE:  Colonoscopy with snare polypectomy     ASA CLASS:  Class II     INDICATIONS:  1) history of pre-cancerous (adenomatous) colon     polyps           MEDICATIONS:   Fentanyl 25 mcg IV, Versed 4 mg IV           DESCRIPTION OF PROCEDURE:   After the risks benefits and     alternatives of the procedure were thoroughly explained, informed     consent was obtained.  Digital rectal exam was performed and     revealed no abnormalities.   The LB CF-H180AL [id number] endoscope     was introduced through the anus and advanced to the cecum, which     was identified by both the appendix and ileocecal valve, without     limitations.  The quality of the prep was good, using MoviPrep.     The instrument was then slowly withdrawn as the colon was fully     examined.     <<PROCEDUREIMAGES>>           FINDINGS:  A sessile polyp was found in the ascending colon. It     was 4 mm in size. Polyp was snared without cautery. Retrieval was     successful (see image4). snare polyp  Moderate to severe     diverticulosis was found in the sigmoid colon (see image8).     Moderate to severe diverticular changes with diverticula, muscular     hypertrophy and lumenal narrowing  Scattered diverticula were     found in the descending colon (see image6).  This was otherwise a     normal examination of the colon (see image1, image2, image5,     image9, and image10).   Retroflexed views in the rectum revealed    no abnormalities.    The time to cecum =  9  minutes. The scope     was then withdrawn (time =  8.25  min) from the patient and the     procedure completed.           COMPLICATIONS:  None           ENDOSCOPIC IMPRESSION:     1) 4 mm sessile polyp in the ascending colon     2) Moderate diverticulosis in the sigmoid colon     3) Diverticula, scattered in the descending colon     4) Otherwise normal examination     RECOMMENDATIONS:     1) If the polyp(s) removed today are proven to be adenomatous     (pre-cancerous) polyps, you will need a repeat colonoscopy  in 5     years. Otherwise you should continue to follow colorectal cancer     screening guidelines for "routine risk" patients with colonoscopy     in 10 years.     2) resume plavix in 5 days           REPEAT EXAM:  In You will receive a letter from Dr. Arlyce Dice in 1-2     weeks, after reviewing the final pathology, with followup     recommendations. for.           ______________________________     Barbette Hair. Arlyce Dice, MD           CC: Marisue Brooklyn, DO           n.     eSIGNED:   Barbette Hair. Kaplan at 04/08/2010 04:48 PM           Fawbush, Claris Che, [phone]  Note: An exclamation mark (!) indicates a result that was not dispersed into the flowsheet. Document Creation Date: 04/08/2010 4:49 PM _______________________________________________________________________  (1) Order result status: Final Collection or observation date-time: 04/08/2010 16:41 Requested date-time:  Receipt date-time:  Reported date-time:  Referring Physician:   Ordering Physician: Melvia Heaps [phone]) Specimen Source:  Source: Launa Grill Order Number: [id number] Lab site:   Appended Document: Colonoscopy     Procedures Next Due Date:    Colonoscopy: 03/2015

## 2011-01-26 NOTE — Letter (Signed)
Summary: Patient Notice- Polyp Results  [facility]  [address] [place], [place] [address]   Phone: [phone]  Fax: [phone]        April 18, 2010 MRN: [id number]    [facility] [address] [facility] [place], [place]  [address]    Dear Ms. Duff,  I am pleased to inform you that the colon polyp(s) removed during your recent colonoscopy was (were) found to be benign (no cancer detected) upon pathologic examination.  I recommend you have a repeat colonoscopy examination in _5 years to look for recurrent polyps, as having colon polyps increases your risk for having recurrent polyps or even colon cancer in the future.  Should you develop new or worsening symptoms of abdominal pain, bowel habit changes or bleeding from the rectum or bowels, please schedule an evaluation with either your primary care physician or with me.  Additional information/recommendations:  __ No further action with gastroenterology is needed at this time. Please      follow-up with your primary care physician for your other healthcare      needs.  __ Please call [phone] to schedule a return visit to review your      situation.  __ Please keep your follow-up visit as already scheduled.  _x_ Continue treatment plan as outlined the day of your exam.  Please call us if you are having persistent problems or have questions about your condition that have not been fully answered at this time.  Sincerely,  Louis Meckel MD  This letter has been electronically signed by your physician.  Appended Document: Patient Notice- Polyp Results letter mailed 4.26.11

## 2011-01-26 NOTE — Letter (Signed)
Summary: Diabetic Instructions  [facility]  [address] [place], [place] [address]   Phone: [phone]  Fax: [phone]    [name] Aug 24, 1925 MRN: [id number]   X    ORAL DIABETIC MEDICATION INSTRUCTIONS  GLIPIZIDE  The day before your procedure:   Take your diabetic pill as you do normally  The day of your procedure:   Do not take your diabetic pill    We will check your blood sugar levels during the admission process and again in Recovery before discharging you home  ________________________________________________________________________  _  _   INSULIN (LONG ACTING) MEDICATION INSTRUCTIONS (Lantus, NPH, 70/30, Humulin, Novolin-N)   The day before your procedure:   Take  your regular evening dose    The day of your procedure:   Do not take your morning dose    _  _   INSULIN (SHORT ACTING) MEDICATION INSTRUCTIONS (Regular, Humulog, Novolog)   The day before your procedure:   Do not take your evening dose   The day of your procedure:   Do not take your morning dose   _  _   INSULIN PUMP MEDICATION INSTRUCTIONS  We will contact the physician managing your diabetic care for written dosage instructions for the day before your procedure and the day of your procedure.  Once we have received the instructions, we will contact you.

## 2011-01-26 NOTE — Assessment & Plan Note (Signed)
Summary: POST ENDO., GES AND STARTING REGLAN               DEBORAH   History of Present Illness Visit Type: Follow-up Visit Primary GI MD: Melvia Heaps MD [facility] Primary Provider: Marisue Brooklyn, DO Requesting Provider: n/a Chief Complaint: F/u from ENDO, GES, and starting Reglan  History of Present Illness:   Mrs. Scioneaux has returned following upper endoscopy and gastric emptying scan.  The former demonstrated gastritis and retained food.  Gastroparesis was demonstrated on the latter.  She was placed on Reglan and Nexium.  Her abdominal pain is gone.  Nausea has not significantly improved.   GI Review of Systems      Denies abdominal pain, acid reflux, belching, bloating, chest pain, dysphagia with liquids, dysphagia with solids, heartburn, loss of appetite, nausea, vomiting, vomiting blood, weight loss, and  weight gain.        Denies anal fissure, black tarry stools, change in bowel habit, constipation, diarrhea, diverticulosis, fecal incontinence, heme positive stool, hemorrhoids, irritable bowel syndrome, jaundice, light color stool, liver problems, rectal bleeding, and  rectal pain.    Current Medications (verified): 1)  Plavix 75 Mg Tabs (Clopidogrel Bisulfate) .Marland Kitchen.. 1 By Mouth Once Daily 2)  Lasix 40 Mg Tabs (Furosemide) .Marland Kitchen.. 1 By Mouth Two Times A Day 3)  Synthroid 125 Mcg Tabs (Levothyroxine Sodium) .Marland Kitchen.. 1 By Mouth Once Daily 4)  Simvastatin 20 Mg Tabs (Simvastatin) .Marland Kitchen.. 1 By Mouth Once Daily 5)  Detrol La 4 Mg Xr24h-Cap (Tolterodine Tartrate) .Marland Kitchen.. 1 By Mouth Once Daily 6)  Atenolol 50 Mg Tabs (Atenolol) .Marland Kitchen.. 1 By Mouth Once Daily 7)  Vitamin D 2000 Unit Tabs (Cholecalciferol) .Marland Kitchen.. 1 By Mouth Once Daily 8)  Glipizide 5 Mg Tabs (Glipizide) .Marland Kitchen.. 1 By Mouth Two Times A Day 9)  Diltiazem Hcl Coated Beads 360 Mg Xr24h-Tab (Diltiazem Hcl Coated Beads) .Marland Kitchen.. 1 By Mouth Once Daily 10)  Multivitamins  Tabs (Multiple Vitamin) .... Take 2 Tablets By Mouth Once Daily 11)  Nexium 40 Mg  Cpdr  (Esomeprazole Magnesium) .Marland Kitchen.. 1 Capsule Each Day 30 Minutes Before Meal 12)  Reglan 10 Mg Tabs (Metoclopramide Hcl) .... Take One Tablet 30 Minutes Before Breakfast, Lunch, Dinner and At Bedtime.  Allergies (verified): 1)  ! Pcn  Past History:  Past Medical History: Reviewed history from 02/24/2010 and no changes required. Chronic Kidney Disease Hypothyroidism Hypertension Diabetes Hyperlipidemia GERD hx of heart blockage osteoarthritis urinary incontinence  Past Surgical History: Reviewed history from 03/01/2010 and no changes required. pacemaker placement  Family History: Reviewed history from 03/01/2010 and no changes required. No FH of Colon Cancer: Family History of Diabetes: Brothers x 2, Sister Family History of Heart Disease: Father  Social History: Reviewed history from 03/01/2010 and no changes required. Patient is a former smoker. -only smoked a few years...stopped at age 77 Alcohol Use - no Daily Caffeine Use-1-2 per week Illicit Drug Use - no  Review of Systems  The patient denies allergy/sinus, anemia, anxiety-new, arthritis/joint pain, back pain, blood in urine, breast changes/lumps, change in vision, confusion, cough, coughing up blood, depression-new, fainting, fatigue, fever, headaches-new, hearing problems, heart murmur, heart rhythm changes, itching, menstrual pain, muscle pains/cramps, night sweats, nosebleeds, pregnancy symptoms, shortness of breath, skin rash, sleeping problems, sore throat, swelling of feet/legs, swollen lymph glands, thirst - excessive , urination - excessive , urination changes/pain, urine leakage, vision changes, and voice change.    Vital Signs:  Patient profile:   77 year old female Height:  71 inches Weight:      172 pounds BMI:     24.08 BSA:     1.98 Pulse rate:   76 / minute Pulse rhythm:   regular BP sitting:   128 / 76  (left arm) Cuff size:   regular  Vitals Entered By: Ok Anis CMA (March 30, 2010 1:27  PM)   Impression & Recommendations:  Problem # 1:  GASTROPARESIS (ICD-536.3) Patient has documented gastroparesis which undoubtedly is contributing to her symptoms.  She has not responded to Reglan.  Recommendations #1 trial of domperidone 10 mg one half hour a.c. and h.s.  Problem # 2:  ABDOMINAL PAIN, EPIGASTRIC (ICD-789.06)  This likely is due to her gastritis.  Plan to continue Nexium.  Orders: Colonoscopy (Colon)  Problem # 3:  SPECIAL SCREENING FOR MALIGNANT NEOPLASMS COLON (ICD-V76.51) Plan screening colonoscopy.  Plavix will be held in the past of the procedure.  Risks, alternatives, and complications of the procedure, including bleeding, perforation, and possible need for surgery, were explained to the patient.  Patient's questions were answered.  Patient Instructions: 1)  cc Amy Elisabeth Most, DO 2)  Colonoscopy and Flexible Sigmoidoscopy brochure given.  3)  Conscious Sedation brochure given.  4)  Colonoscopy [facility] 04/07/10 8:30 am  5)  Hold Plavix x 7 days prior to procedure. 6)  Hold diabetic meds morning of procedure. 7)  Rx. for Donperidone 10 mg given to pt. with info to call [place]. 8)  Movi prep Rx. printed and given to patient. 9)  The medication list was reviewed and reconciled.  All changed / newly prescribed medications were explained.  A complete medication list was provided to the patient / caregiver. Prescriptions: DONPERIDONE 10 MG TAKE 1 TABLET PO ONE HALF HOUR BEFORE MEALS AND AT BEDTIME.  #120 x 12   Entered by:   Milford Cage NCMA   Authorized by:   Louis Meckel MD   Signed by:   Milford Cage NCMA on 03/30/2010   Method used:   Historical   RxID:   [id number] MOVIPREP 100 GM  SOLR (PEG-KCL-NACL-NASULF-NA ASC-C) As per prep instructions.  #1 x 0   Entered by:   Milford Cage NCMA   Authorized by:   Louis Meckel MD   Signed by:   Milford Cage NCMA on 03/30/2010   Method used:   Print then Give to Patient   RxID:   [id number]

## 2011-02-01 ENCOUNTER — Emergency Department (HOSPITAL_COMMUNITY): Payer: MEDICARE

## 2011-02-01 ENCOUNTER — Inpatient Hospital Stay (HOSPITAL_COMMUNITY)
Admission: EM | Admit: 2011-02-01 | Discharge: 2011-02-07 | DRG: 470 | Disposition: A | Discharge status: Skilled Nursing Facility | Payer: MEDICARE | Attending: Internal Medicine | Admitting: Internal Medicine

## 2011-02-01 DIAGNOSIS — Z9119 Patient's noncompliance with other medical treatment and regimen: Secondary | ICD-10-CM

## 2011-02-01 DIAGNOSIS — Z91199 Patient's noncompliance with other medical treatment and regimen due to unspecified reason: Secondary | ICD-10-CM

## 2011-02-01 DIAGNOSIS — I2510 Atherosclerotic heart disease of native coronary artery without angina pectoris: Secondary | ICD-10-CM | POA: Diagnosis present

## 2011-02-01 DIAGNOSIS — Y9301 Activity, walking, marching and hiking: Secondary | ICD-10-CM

## 2011-02-01 DIAGNOSIS — E039 Hypothyroidism, unspecified: Secondary | ICD-10-CM | POA: Diagnosis present

## 2011-02-01 DIAGNOSIS — E119 Type 2 diabetes mellitus without complications: Secondary | ICD-10-CM | POA: Diagnosis present

## 2011-02-01 DIAGNOSIS — I5032 Chronic diastolic (congestive) heart failure: Secondary | ICD-10-CM | POA: Diagnosis present

## 2011-02-01 DIAGNOSIS — D62 Acute posthemorrhagic anemia: Secondary | ICD-10-CM | POA: Diagnosis not present

## 2011-02-01 DIAGNOSIS — Z950 Presence of cardiac pacemaker: Secondary | ICD-10-CM

## 2011-02-01 DIAGNOSIS — E785 Hyperlipidemia, unspecified: Secondary | ICD-10-CM | POA: Diagnosis present

## 2011-02-01 DIAGNOSIS — W010XXA Fall on same level from slipping, tripping and stumbling without subsequent striking against object, initial encounter: Secondary | ICD-10-CM | POA: Diagnosis present

## 2011-02-01 DIAGNOSIS — I129 Hypertensive chronic kidney disease with stage 1 through stage 4 chronic kidney disease, or unspecified chronic kidney disease: Secondary | ICD-10-CM | POA: Diagnosis present

## 2011-02-01 DIAGNOSIS — Z9861 Coronary angioplasty status: Secondary | ICD-10-CM

## 2011-02-01 DIAGNOSIS — Y92009 Unspecified place in unspecified non-institutional (private) residence as the place of occurrence of the external cause: Secondary | ICD-10-CM

## 2011-02-01 DIAGNOSIS — I4891 Unspecified atrial fibrillation: Secondary | ICD-10-CM | POA: Diagnosis present

## 2011-02-01 DIAGNOSIS — I509 Heart failure, unspecified: Secondary | ICD-10-CM | POA: Diagnosis present

## 2011-02-01 DIAGNOSIS — N183 Chronic kidney disease, stage 3 unspecified: Secondary | ICD-10-CM | POA: Diagnosis present

## 2011-02-01 DIAGNOSIS — S72033A Displaced midcervical fracture of unspecified femur, initial encounter for closed fracture: Principal | ICD-10-CM | POA: Diagnosis present

## 2011-02-01 LAB — BASIC METABOLIC PANEL
BUN: 33 mg/dL — ABNORMAL HIGH (ref 6–23)
CO2: 28 mEq/L (ref 19–32)
Calcium: 9.7 mg/dL (ref 8.4–10.5)
Chloride: 101 mEq/L (ref 96–112)
Potassium: 4.5 mEq/L (ref 3.5–5.1)
Sodium: 141 mEq/L (ref 135–145)

## 2011-02-01 LAB — CBC
HCT: 41.4 % (ref 36.0–46.0)
MCH: 29.4 pg (ref 26.0–34.0)
MCV: 89.6 fL (ref 78.0–100.0)
Platelets: 146 10*3/uL — ABNORMAL LOW (ref 150–400)
RBC: 4.62 MIL/uL (ref 3.87–5.11)
RDW: 14 % (ref 11.5–15.5)
WBC: 7.9 10*3/uL (ref 4.0–10.5)

## 2011-02-01 LAB — DIFFERENTIAL
Lymphs Abs: 1.1 10*3/uL (ref 0.7–4.0)
Neutrophils Relative %: 77 % (ref 43–77)

## 2011-02-01 LAB — URINE MICROSCOPIC-ADD ON

## 2011-02-01 LAB — URINALYSIS, ROUTINE W REFLEX MICROSCOPIC
Ketones, ur: NEGATIVE mg/dL
Nitrite: NEGATIVE

## 2011-02-02 ENCOUNTER — Inpatient Hospital Stay (HOSPITAL_COMMUNITY): Payer: MEDICARE

## 2011-02-02 LAB — CBC
HCT: 37.6 % (ref 36.0–46.0)
Hemoglobin: 12.3 g/dL (ref 12.0–15.0)
RDW: 13.9 % (ref 11.5–15.5)
WBC: 6.4 10*3/uL (ref 4.0–10.5)

## 2011-02-02 LAB — DIFFERENTIAL
Basophils Absolute: 0 10*3/uL (ref 0.0–0.1)
Basophils Relative: 0 % (ref 0–1)
Lymphocytes Relative: 8 % — ABNORMAL LOW (ref 12–46)
Neutro Abs: 5.4 10*3/uL (ref 1.7–7.7)
Neutrophils Relative %: 84 % — ABNORMAL HIGH (ref 43–77)

## 2011-02-02 LAB — PHOSPHORUS: Phosphorus: 3.6 mg/dL (ref 2.3–4.6)

## 2011-02-02 LAB — GLUCOSE, CAPILLARY
Glucose-Capillary: 139 mg/dL — ABNORMAL HIGH (ref 70–99)
Glucose-Capillary: 110 mg/dL — ABNORMAL HIGH (ref 70–99)
Glucose-Capillary: 175 mg/dL — ABNORMAL HIGH (ref 70–99)

## 2011-02-02 LAB — TSH: TSH: 3.287 u[IU]/mL (ref 0.350–4.500)

## 2011-02-02 LAB — COMPREHENSIVE METABOLIC PANEL
AST: 29 U/L (ref 0–37)
Albumin: 3.6 g/dL (ref 3.5–5.2)
Alkaline Phosphatase: 51 U/L (ref 39–117)
BUN: 24 mg/dL — ABNORMAL HIGH (ref 6–23)
CO2: 29 mEq/L (ref 19–32)
Chloride: 102 mEq/L (ref 96–112)
GFR calc non Af Amer: 38 mL/min — ABNORMAL LOW (ref >60)
Potassium: 3.3 mEq/L — ABNORMAL LOW (ref 3.5–5.1)
Total Bilirubin: 0.7 mg/dL (ref 0.3–1.2)

## 2011-02-02 LAB — ABO/RH: ABO/RH(D): A POS

## 2011-02-02 LAB — PROTIME-INR
INR: 1.18 (ref 0.00–1.49)
Prothrombin Time: 15.2 seconds (ref 11.6–15.2)

## 2011-02-02 LAB — LIPID PANEL
HDL: 57 mg/dL (ref >39)
Total CHOL/HDL Ratio: 3 RATIO

## 2011-02-02 LAB — APTT: aPTT: 25 seconds (ref 24–37)

## 2011-02-02 LAB — SURGICAL PCR SCREEN: Staphylococcus aureus: POSITIVE — AB

## 2011-02-02 LAB — BRAIN NATRIURETIC PEPTIDE: Pro B Natriuretic peptide (BNP): 130 pg/mL — ABNORMAL HIGH (ref 0.0–100.0)

## 2011-02-03 LAB — CBC
Hemoglobin: 9 g/dL — ABNORMAL LOW (ref 12.0–15.0)
MCH: 28.8 pg (ref 26.0–34.0)
MCHC: 31.5 g/dL (ref 30.0–36.0)
Platelets: 93 10*3/uL — ABNORMAL LOW (ref 150–400)

## 2011-02-03 LAB — COMPREHENSIVE METABOLIC PANEL
ALT: 20 U/L (ref 0–35)
Alkaline Phosphatase: 40 U/L (ref 39–117)
CO2: 31 mEq/L (ref 19–32)
Chloride: 106 mEq/L (ref 96–112)
GFR calc non Af Amer: 38 mL/min — ABNORMAL LOW (ref >60)
Glucose, Bld: 176 mg/dL — ABNORMAL HIGH (ref 70–99)
Potassium: 3.9 mEq/L (ref 3.5–5.1)
Sodium: 143 mEq/L (ref 135–145)
Total Bilirubin: 0.8 mg/dL (ref 0.3–1.2)
Total Protein: 5 g/dL — ABNORMAL LOW (ref 6.0–8.3)

## 2011-02-03 LAB — URINALYSIS, MICROSCOPIC ONLY
Ketones, ur: NEGATIVE mg/dL
Protein, ur: 30 mg/dL — AB
Urine Glucose, Fasting: NEGATIVE mg/dL

## 2011-02-03 LAB — GLUCOSE, CAPILLARY: Glucose-Capillary: 154 mg/dL — ABNORMAL HIGH (ref 70–99)

## 2011-02-04 LAB — CBC
Hemoglobin: 8.5 g/dL — ABNORMAL LOW (ref 12.0–15.0)
MCH: 29.1 pg (ref 26.0–34.0)
MCV: 91.8 fL (ref 78.0–100.0)
RBC: 2.92 MIL/uL — ABNORMAL LOW (ref 3.87–5.11)

## 2011-02-04 LAB — BASIC METABOLIC PANEL
BUN: 23 mg/dL (ref 6–23)
CO2: 29 mEq/L (ref 19–32)
Chloride: 108 mEq/L (ref 96–112)
Creatinine, Ser: 1.69 mg/dL — ABNORMAL HIGH (ref 0.4–1.2)
Glucose, Bld: 152 mg/dL — ABNORMAL HIGH (ref 70–99)

## 2011-02-04 LAB — GLUCOSE, CAPILLARY
Glucose-Capillary: 114 mg/dL — ABNORMAL HIGH (ref 70–99)
Glucose-Capillary: 149 mg/dL — ABNORMAL HIGH (ref 70–99)

## 2011-02-05 LAB — CBC
Hemoglobin: 9.7 g/dL — ABNORMAL LOW (ref 12.0–15.0)
RBC: 3.31 MIL/uL — ABNORMAL LOW (ref 3.87–5.11)

## 2011-02-05 LAB — BASIC METABOLIC PANEL
CO2: 30 mEq/L (ref 19–32)
Calcium: 9 mg/dL (ref 8.4–10.5)
Chloride: 107 mEq/L (ref 96–112)
GFR calc Af Amer: 34 mL/min — ABNORMAL LOW (ref >60)
Sodium: 144 mEq/L (ref 135–145)

## 2011-02-05 LAB — GLUCOSE, CAPILLARY
Glucose-Capillary: 124 mg/dL — ABNORMAL HIGH (ref 70–99)
Glucose-Capillary: 165 mg/dL — ABNORMAL HIGH (ref 70–99)

## 2011-02-05 NOTE — H&P (Signed)
[name], LOSASSO             ACCOUNT NO.:  [id number]  MEDICAL RECORD NO.:  [id number]           PATIENT TYPE:  E  LOCATION:  [facility]                         FACILITY:  [facility]  PHYSICIAN:  Kathlen Mody, MD       DATE OF BIRTH:  Nov 27, 1925  DATE OF ADMISSION:  02/01/2011 DATE OF DISCHARGE:                             HISTORY & PHYSICAL   PRIMARY CARE PHYSICIAN:  [name], [name]  CARDIOLOGIST:  Thereasa Solo. Little, [name] from [facility].  CHIEF COMPLAINT:  Status post fall at home with left femur fracture, hypertensive urgency.  HISTORY OF PRESENT ILLNESS:  This is a 77 year old lady with past medical history of chronic diastolic heart failure, coronary artery disease, second degree heart block status post pacemaker, diabetes, hypertension, chronic kidney disease, history of multiple falls in the past, who was brought in for status post fall at home this morning.  The patient relates by herself.  She was on her way to the bathroom this morning and tripped and fell.  The patient could not get up.  The patient denies any loss of consciousness.  The patient denies hitting her head or neck and after about an hour, her home health aide who helped her with her medications came in but she could not get into the house as the house was long.  She got help from an outside person to open the door and called the family and brought her to the ER.  The patient also denied any chest pain, headache, shortness of breath, syncope, or dizziness.  She denies any fever or chills.  The patient denies any nausea, vomiting, diarrhea, or abdominal pain.  The patient denies any weakness, any tingling or numbness, or any sensory abnormalities.  She denies any cough, any fever, etc.  REVIEW OF SYSTEMS:  See HPI, otherwise negative.  PAST MEDICAL HISTORY: 1. Hypertension. 2. Hypothyroidism. 3. Chronic kidney disease. 4. Non-insulin dependent diabetes mellitus. 5.  Hyperlipidemia. 6. GERD. 7. Second-degree heart block status post pacemaker placement. 8. Coronary artery disease, status post PCI in RCA in 2008. 9. Paroxysmal atrial fibrillation. 10.Chronic diastolic heart failure. 11.Osteoarthritis.  PAST SURGICAL HISTORY: 1. Hysterectomy. 2. Status post pacemaker. 3. PCI in RCA in 1008.  FAMILY HISTORY:  Coronary artery disease and diabetes in the parents.  SOCIAL HISTORY:  The patient lives by herself.  Has a home health aide who comes in to help give the medications.  Denies any smoking, EtOH or IV drug abuse.  ALLERGIES:  She is allergic to PENICILLIN.  HOME MEDICATIONS: 1. Aspirin 81 mg daily. 2. Diltiazem 360 mg daily. 3. Isosorbide mononitrate 30 mg daily. 4. Vesicare 5 mg daily. 5. Levothyroxin 125 mcg daily. 6. Amiodarone 200 mg daily. 7. Simvastatin once daily. 8. Lasix 40 mg daily. 9. Glipizide 5 mg twice a day. 10.Colace 100 mg as needed. 11.Timolol maleate eyedrops.  PHYSICAL EXAMINATION:  VITAL SIGNS:  She has a temperature of 98 degree Fahrenheit, pulse rate of 70 per minute, respiratory rate of 18 per minute.  She came in with blood pressure of 240/140.  Her blood pressure at this time is  190/80. GENERAL:  On exam, she is alert, afebrile, in mild distress from left lower extremity pain from the fracture. HEENT EXAM:  No JVD.  Pupils equal and reacting to light.  Moist mucous membranes. CARDIOVASCULAR EXAM:  S1 and S2 heard.  Murmur present.  Regular rate rhythm. RESPIRATORY EXAM:  Good air entry bilateral. ABDOMINAL EXAM:  Soft, nontender, nondistended. Good bowel sounds. EXTREMITIES:  Left lower extremity is tender to palpation and externally rotated. No pedal edema bilateral. NEUROLOGICAL EXAM:  Could not be assessed secondary to her left lower extremity pain but she is able to answer all questions, able to move the rest of her extremities except for the left lower extremity.  PERTINENT LABS:  The patient had  a CBC which was pretty much normal except for platelet count of 146,000, INR of 1.12, PTT of 24.  Basic metabolic panel showing sodium of 141, potassium of 4.5, chloride 101, bicarb 28, glucose of 143, BUN 33, creatinine of 1.77, calcium of 9.7. Urinalysis showed small blood, negative leukocytes and nitrites with many bacteria.  RADIOLOGY:  X-ray of the hip shows subcapital fracture of the proximal left femur.  EKG; paced at 76 per minute, left axis deviation.  ASSESSMENT: 1. History of fall with left femur fracture. 2. Hypertensive urgency. 3. Diabetes. 4. Coronary artery disease. 5. Chronic diastolic heart failure. 6. Chronic kidney disease. 7. Hypothyroidism. 8. Hyperlipidemia.  PLAN: 1. History of fall with left femur fracture.  Ortho consult was     called.  The patient will be going to the surgery for fracture with open     reduction possibly tomorrow after cardiology cleares her for     surgery.  At this time, the pain control with hydrocodone 5/325 one     to two tablets q.4 h. p.r.n. 2. Hypertensive urgency.  The patient's blood pressure at this time is     190/80.  She denies any symptoms at this time. Elevated blood     pressure is most likely secondary to noncompliance to medications.     At this point, we will give her all her morning medications at this     time and monitor to see if her blood pressure comes down in a     couple of hours if she is symptomatic or if her blood pressure     remains persistently elevated more than 180/80, then will start her     on IV labetalol drip. 3. Coronary artery disease.  The patient is on aspirin and Plavix.     Will continue the same. 4. Chronic diastolic heart failure.  The patient is compensated at     this time.  She does not appear to be in heart failure.  We will     continue with her medications of Lasix 40 mg daily.  We will hold     the lisinopril secondary to her elevated creatinine, continue with     isosorbide  mononitrate, diltiazem 360 mg daily. 5. Chronic kidney disease.  The patient has a stage III chronic kidney     disease.  Her creatinine usually ranges from 1.5 to 3.  At this     time, her creatinine is 1.77.  We will hold the lisinopril and will     give her some gentle hydration at 50 cc per hour after the blood     pressure has come down and will continue to monitor the creatinine. 6. Hypothyroidism.  Will get a TSH level in  the morning.  Continue     with her levothyroxine home dose. 7. Diabetes.  Will put her on NovoLog sliding scale a.c. h.s.  Will     hold her glipizide while she is in the hospital. Will get a     hemoglobin A1c. 8. Hyperlipidemia.  Get a lipid profile in the morning.  Continue with     simvastatin at bedtime daily home dose. 9. Get a cardiology consult from Dr. Clarene Duke in the morning to clear     her for surgery. 10.DVT prophylaxis, subcutaneous Lovenox.  She is full code.  Time     spent with the patient with the care of the patient about 40     minutes.          ______________________________ Kathlen Mody, MD     VA/MEDQ  D:  02/01/2011  T:  02/01/2011  Job:  [id number]  Electronically Signed by Kathlen Mody MD on 02/04/2011 04:54:09 PM

## 2011-02-06 LAB — CBC
HCT: 32.6 % — ABNORMAL LOW (ref 36.0–46.0)
MCH: 28.9 pg (ref 26.0–34.0)
MCV: 90.6 fL (ref 78.0–100.0)
Platelets: 126 10*3/uL — ABNORMAL LOW (ref 150–400)
RBC: 3.6 MIL/uL — ABNORMAL LOW (ref 3.87–5.11)

## 2011-02-06 LAB — CROSSMATCH: Unit division: 0

## 2011-02-06 LAB — GLUCOSE, CAPILLARY
Glucose-Capillary: 229 mg/dL — ABNORMAL HIGH (ref 70–99)
Glucose-Capillary: 132 mg/dL — ABNORMAL HIGH (ref 70–99)
Glucose-Capillary: 192 mg/dL — ABNORMAL HIGH (ref 70–99)
Glucose-Capillary: 149 mg/dL — ABNORMAL HIGH (ref 70–99)

## 2011-02-09 NOTE — Op Note (Signed)
  NAMEARMANI, GAWLIK             ACCOUNT NO.:  [id number]  MEDICAL RECORD NO.:  [id number]           PATIENT TYPE:  I  LOCATION:  1440                         FACILITY:  [facility]  PHYSICIAN:  Myrtie Neither, MD      DATE OF BIRTH:  [birth year]  DATE OF PROCEDURE:  02/02/2011 DATE OF DISCHARGE:                              OPERATIVE REPORT   PREOPERATIVE DIAGNOSIS:  Left femoral neck fracture.  POSTOPERATIVE DIAGNOSIS:  Left femoral neck fracture.  ANESTHESIA:  General.  PROCEDURE:  Left bipolar hip arthroplasty, Biomet implant.  PROCEDURE IN DETAIL:  The patient was taken to the operating room after given adequate preoperative medications, given general anesthesia and intubated.  The patient was placed in the left lateral position.  Left hip was prepped DuraPrep and draped in a sterile manner.  Bovie was used for hemostasis.  Posterior _southern_________ approach was made of left hip on to the skin and subcutaneous tissue down to the fascia, short rotators were identified and released.  Capsules identified and incised. Hematoma was evacuated from the fracture site.  Femoral head was removed with the oscillating saw.  The neck was sharpened.  Acetabulum evaluation was intact and no loose fragments.  Irrigation was done, _Measurement of the_________ femoral head was that of 48 mm.  Reamed it down the femoral canal, was then done with use of a rasp.  Good implant grip was that of 13.5 mm.  A 48 mm head was found to be more stable.  With the trial implant in place, range of motion was found to be good, good leg length was noted and good stability.  No subluxation.  The irrigation was then done, trial implant was removed and the final implant was put in place and again range of motion was good.  Good leg length and stable hip. Calf was approximated with 1-0 Vicryl, 3-0 for the fascia, 2-0 for the subcutaneous and skin staples for the skin.  Dressing was applied.  Hip abduction pillow  was applied.  The patient tolerated the procedure quite well and was sent to recovery room in stable and satisfactory condition.     Myrtie Neither, MD     AC/MEDQ  D:  02/02/2011  T:  02/03/2011  Job:  [id number]  Electronically Signed by Myrtie Neither MD on 02/09/2011 02:04:01 PM

## 2011-02-09 NOTE — Consult Note (Signed)
  [name], [name]             ACCOUNT NO.:  [id number]  MEDICAL RECORD NO.:  [id number]           PATIENT TYPE:  I  LOCATION:  1440                         FACILITY:  [facility]  PHYSICIAN:  Myrtie Neither, MD      DATE OF BIRTH:  [birth year]  DATE OF CONSULTATION:  02/01/2011 DATE OF DISCHARGE:                                CONSULTATION   REFERRING PHYSICIAN:  [facility], Dione Booze, MD  REASON FOR CONSULTATION:  Left femoral neck fracture.  PERTINENT HISTORY:  This is an 77 year old black female who states that she was on the way to the bathroom and felt that she tripped and fell, sustaining injury to her left hip.  The patient was brought to [facility] Emergency Room for treatment.  The patient does live alone, is ambulatory, denies any loss of consciousness.  PAST MEDICAL HISTORY: 1. Hypertension. 2. Diabetes mellitus. 3. Congestive heart failure. 4. DVT. 5. Pacemaker. 6. Hypothyroidism. 7. GERD. 8. History of hysterectomy, cholecystectomy.  ALLERGIES:  PENICILLIN.  SOCIAL HISTORY:  Negative for use of tobacco or alcohol.  FAMILY HISTORY:  Hypertension, diabetes mellitus.  MEDICATIONS: 1. Aspirin. 2. VESIcare 5 mg. 3. Isosorbide 30 mg. 4. Colace 100 mg. 5. Timolol eye drops. 6. Muro-128 eye drops. 7. Lisinopril 20 mg daily. 8. Levothroid 125 mcg daily. 9. Diltiazem 360 mg daily. 10.Simvastatin 20 mg daily. 11.Lasix 80 mg daily. 12.Glipizide 5 mg b.i.d. 13.Amiodarone 300 daily.  REVIEW OF SYSTEMS:  History of congestive heart failure, shortness of breath.  No urinary or bowel symptoms.  PHYSICAL EXAMINATION:  VITAL SIGNS:  Blood pressure 192/81, pulse 70, respirations 18, temperature 98, O2 saturation 96%. GENERAL:  Alert and oriented with daughter at the bedside. HEENT:  HEAD:  Normocephalic.  Eyes, conjunctivae are clear. NECK:  Supple. CHEST:  Clear.  Pulses intact. EXTREMITIES:  Left hip, tender anterolaterally, externally  rotated, shortened. SKIN: Intact.  IMAGING:  X-ray revealed displaced subcapital fracture, left hip.  IMPRESSION:  History of congestive heart failure; diabetes mellitus; hypertension; pacemaker; left hip fracture, subcapital  PLAN:  Medical evaluation with the patient was found to be stable.  PLAN:  Bipolar left hip prosthesis on February 02, 2011.     Myrtie Neither, MD     AC/MEDQ  D:  02/02/2011  T:  02/03/2011  Job:  [id number]  Electronically Signed by Myrtie Neither MD on 02/09/2011 02:01:44 PM

## 2011-02-10 NOTE — Discharge Summary (Signed)
  NAMEAIYAH, SCARPELLI             ACCOUNT NO.:  [id number]  MEDICAL RECORD NO.:  [id number]           PATIENT TYPE:  I  LOCATION:  1440                         FACILITY:  [facility]  PHYSICIAN:  Pleas Koch, MD        DATE OF BIRTH:  1925/12/06  DATE OF ADMISSION:  02/01/2011 DATE OF DISCHARGE:                              DISCHARGE SUMMARY   ADDENDUM: She was reviewed on day of discharge.  Vital signs were stable, temperature 98.3, pulse 60, respirations 18, blood pressures 160s/70s, satting 99% on 2 L.  CBGs were well controlled.  The patient was stable and discharged in stable state to the nursing facility.  The patient will benefit from having CBC and CMP drawn in 1-2 days to determine kidney function in view of her renal insufficiency as well as the fact that she has been transfused as dictated prior.          ______________________________ Pleas Koch, MD     JS/MEDQ  D:  02/07/2011  T:  02/07/2011  Job:  [id number]  Electronically Signed by Pleas Koch MD on 02/10/2011 03:10:16 PM

## 2011-02-10 NOTE — Discharge Summary (Signed)
[name], [name]             ACCOUNT NO.:  [id number]  MEDICAL RECORD NO.:  [id number]           PATIENT TYPE:  I  LOCATION:  1440                         FACILITY:  [facility]  PHYSICIAN:  Pleas Koch, MD        DATE OF BIRTH:  May 23, 1925  DATE OF ADMISSION:  02/01/2011 DATE OF DISCHARGE:  02/06/2011                              DISCHARGE SUMMARY   PRIMARY CARE PHYSICIAN:  [name], [name]  CARDIOLOGIST:  Thereasa Solo. Little, [name]  ORTHOPEDIC SURGEON:  Myrtie Neither, [name]  DISCHARGE DIAGNOSES: 1. Left humeral fracture/subcapital fracture proximal left femur. 2. History of coronary artery disease, status post percutaneous     coronary intervention placement in right coronary artery in 2008. 3. Secondary heart block, status post pacemaker. 4. Paroxysmal atrial fibrillation. 5. Chronic diastolic heart failure with an ejection fraction this     admission of 55-60% with grade 1 diastolic dysfunction and E/e     above 15 suggesting marked elevated left ventricle filling     pressure. 6. Chronic kidney disease, stage III. 7. Non-insulin-dependent diabetes mellitus. 8. Hypothyroidism. 9. Hyperlipidemia.  DISCHARGE MEDICATIONS: 1. Amiodarone 300 mg 1 tablet p.o. daily. 2. Labetalol 200 mg t.i.d. 3. Diltiazem 360 mg capsule extended release daily. 4. Polyethylene glycol 17 g daily. 5. VESIcare 10 mg 1 tablet daily. 6. Simvastatin 20 mg daily. 7. Lasix 40 mg daily (prior dose was b.i.d. and this may need to be     adjusted by nursing home physician). 8. Isosorbide mononitrate 30 mg p.o. daily. 9. Enteric-coated aspirin 81 mg daily. 10.Plavix 75 mg 1 tablet daily. 11.Nexium 40 mg 1 capsule daily. 12.Glipizide 5 mg daily. 13.Levothyroxine 125 mcg daily. 14.Vitamin D over-the-counter 1 tablet daily. 15.The patient was given limited prescription of Dilaudid 4 mg q.6 h.     p.r.n. and 20 tablets were given.  The patient will need further     pain management in the nursing  facility.  PERTINENT RADIOLOGICAL STUDIES:  Chest x-ray done February 01, 2011, showed mild cardiomegaly and vasculature normal due to the pacemaker placed, slight chronic elevation of left hemidiaphragm, some old left rib fractures.  Hip x-ray done February 01, 2011, showed subcapital fracture of proximal left femur, angulated fracture with old right angle fracture fragment. X-ray of hip done February 02, 2011, status post hip replacement showed bipolar hemiarthroplasty in place, no fracture identified. Portable pelvis x-ray again showed portions of upper pelvis extruded into the left hip bipolar hemiarthroplasty.  However, appreciable apposition, no evidence of acute fracture or dislocation.  Small amount of air within soft tissues lateral to left hip.  Skin staples are in place.  Impression was no demonstrated complication following left hip hemiarthroplasty.  Echocardiogram done February 02, 2011, showed cavity size normal, moderate concentric hypertrophy, systolic function normal, EF of 55-60%, wall motion normal.  No regional wall motion abnormalities.  Doppler parameters consistent with grade 1 diastolic dysfunction.  E/e is above 15 suggesting markedly elevated left filling pressure.  Aortic valve, there is moderate aortic valve calcification with restricted leaflet motion, moderate stenosis, peak and mean gradients across the aortic valve are 18  and 10 mmHg respectively.  The calculated AVA is 1.38 cm2, valve area is 1.37 cm2.  Mitral valve, calcified annulus, mild regurgitation.  Left atrium is mildly dilated.  Tricuspid valve, mildly regurgitated to pulmonary arteries.  Peak pressure is 45 mmHg.  Inferior vena cava vessels normal in size.  Reading physician was Dr. Zoila Shutter.  BRIEF HOSPITAL HISTORY:  The patient is an 85-year lady with multiple medical issues who was on her way to the bathroom on the morning of admission and tripped and fell.  The patient could not  get up.  The patient denies any loss of consciousness, denies hitting her head or neck, and after about an hour, her home health aide who helps her with medications came and could not get in the house as house was locked. The patient was brought to the ED.  The patient denies any chest pain, headache, shortness of breath, syncope, dizziness, fevers, chills, nausea, vomiting, diarrhea, abdominal pain, or any tingling or sensory abnormalities.  Vitals on admission; blood pressure 240/140, temperature 98 degrees Fahrenheit, pulse rate is 70 per minute, respirations are 18. The patient was given labetalol 100 mg b.i.d. for the elevated blood pressure.  Other pertinent physical findings on exam showed left lower extremity tender to palpation, externally rotated, able to move the rest of her extremities.  Murmurs present on cardiovascular exam.  LABORATORY DATA:  CBC was normal except for platelet count of 146,000, INR 1.12.  BMP showed sodium 141, potassium 4.5, chloride 101, bicarb 28, glucose 143, BUN 33, creatinine 1.77, and calcium 9.7.  UA showed small blood, negative leukocytes or nitrites, with many bacteria, unclear if this is clean-catch or no.  Radiology as above.  EKG; she had a paced rhythm at 76 per minute with left axis deviation.  HOSPITAL COURSE: 1. According to history, the patient was seen by Dr. Montez Morita,     orthopedic surgeon and was given cardiac clearance by Dr. Clarene Duke.     It was felt on review of the patient by Dr. Clarene Duke that this was an     emergent surgery given the risk of malunion and significant     morbidity per same and although there was some paced blocks noted,     the estimated amount of cardiac events would be 3%.  The patient     was kept on perioperative beta blocker, and an echo was also done.     The patient was kept n.p.o. for surgery.  [employer] did come in and     interrogated the device and stated that no changes were needed,     that it was  sensing well with adequate safety margins.  The patient     underwent surgery on February 03, 2011, and did well subsequent to     this.  Physical therapy reviewed the patient subsequent to this and     recommended SNF on discharge as the patient was 2+ total assistance     for bed to chair transfer. 2. Anemia.  The patient is not known to have diagnosis of anemia;     however, underwent hip surgery.  As such, it was felt necessary to     transfuse as her hemoglobin dropped to 8.5 subsequent to surgery     and the fact that she is a heart patient, we did transfuse her 1     unit of packed red blood cells.  Her anemia on date of discharge     was actually  better.  Hemoglobin came up to 10.4 from 8.5.  The     patient will likely need a CBC repeated in 1-2 weeks. 3. Cardiac history:  EF on this admission was 55-65%.  She was     continued on her amiodarone, diltiazem, isosorbide dinitrate,     aspirin, and Lasix.  Please note that her Lasix dose is changed to     40 mg daily.  She may need to be up titrated to Lasix 40 mg b.i.d.     as she did start developing mild crackles on examination; however,     the patient was not short of breath and did not exhibit any lower     extremity edema. 4. Renal insufficiency.  She has a baseline creatinine of about 1.3 in     November 2010.  Her creatinine on admission this hospitalization     was 1.57, went as high as 1.77.  As such, we elected to scale back     on a diuretic, and her BUN/creatinine ratio on discharge was 23 and     1.69 as of February 04, 2011.  This may need to be discussed with     Cardiology in terms of medications needed. 5. Diabetes mellitus.  The patient was kept on Lantus during     hospitalization and kept off oral medications, and she was     increased on Lantus 8 units prior to discharge.  The patient will     continue her glipizide 5 mg on discharge. 6. CAD.  The patient will continue aspirin and Plavix on discharge as      well. 7. Hyperlipidemia.  The patient can continue her simvastatin.     Pharmacy did recommend to switch this off to Crestor in view of     possible interaction with diltiazem. 8. Pain.  The patient was given Dilaudid IV in the hospital and will     likely need to continue on chronic pain meds at least for the short-     term.  DISPOSITION:  The patient was discharged in stable state.  Vitals on discharge were temperature 97.8, pulse 61, blood pressure 145/76, and satting 92% on 2 L.  There was some concern of the patient being more confused than prior and I do not know if this is new onset dementia versus having had anesthesia as the anesthetic agents can cause some confusion, and the patient does have renal insufficiency.  Nevertheless, this will need to be readdressed as an outpatient.  The patient may need a workup for dementia at that time.          ______________________________ Pleas Koch, MD     JS/MEDQ  D:  02/06/2011  T:  02/06/2011  Job:  [id number]  cc:   [name], [name] Fax: [phone]  Thereasa Solo. Little, [name] Fax: [phone]  Myrtie Neither, MD Fax: [phone]  Electronically Signed by Pleas Koch MD on 02/10/2011 03:10:14 PM

## 2011-03-08 LAB — URINALYSIS, ROUTINE W REFLEX MICROSCOPIC
Glucose, UA: 100 mg/dL — AB
Protein, ur: NEGATIVE mg/dL
pH: 6.5 (ref 5.0–8.0)

## 2011-03-08 LAB — COMPREHENSIVE METABOLIC PANEL
ALT: 15 U/L (ref 0–35)
AST: 35 U/L (ref 0–37)
Albumin: 3.7 g/dL (ref 3.5–5.2)
Alkaline Phosphatase: 68 U/L (ref 39–117)
GFR calc Af Amer: 38 mL/min — ABNORMAL LOW (ref >60)
Glucose, Bld: 180 mg/dL — ABNORMAL HIGH (ref 70–99)
Potassium: 3.8 mEq/L (ref 3.5–5.1)
Sodium: 141 mEq/L (ref 135–145)
Total Protein: 7.3 g/dL (ref 6.0–8.3)

## 2011-03-08 LAB — DIFFERENTIAL
Basophils Relative: 0 % (ref 0–1)
Eosinophils Absolute: 0.1 10*3/uL (ref 0.0–0.7)
Eosinophils Relative: 2 % (ref 0–5)
Monocytes Absolute: 0.4 10*3/uL (ref 0.1–1.0)
Monocytes Relative: 11 % (ref 3–12)
Neutrophils Relative %: 58 % (ref 43–77)

## 2011-03-08 LAB — CBC
HCT: 38.7 % (ref 36.0–46.0)
RDW: 14.3 % (ref 11.5–15.5)
WBC: 3.7 10*3/uL — ABNORMAL LOW (ref 4.0–10.5)

## 2011-03-13 ENCOUNTER — Encounter (HOSPITAL_COMMUNITY): Payer: Self-pay | Admitting: Radiology

## 2011-03-13 ENCOUNTER — Emergency Department (HOSPITAL_COMMUNITY): Payer: MEDICARE

## 2011-03-13 ENCOUNTER — Observation Stay (HOSPITAL_COMMUNITY)
Admission: EM | Admit: 2011-03-13 | Discharge: 2011-03-14 | Disposition: A | Discharge status: Skilled Nursing Facility | Payer: MEDICARE | Attending: Internal Medicine | Admitting: Internal Medicine

## 2011-03-13 DIAGNOSIS — I2510 Atherosclerotic heart disease of native coronary artery without angina pectoris: Secondary | ICD-10-CM | POA: Insufficient documentation

## 2011-03-13 DIAGNOSIS — I129 Hypertensive chronic kidney disease with stage 1 through stage 4 chronic kidney disease, or unspecified chronic kidney disease: Secondary | ICD-10-CM | POA: Insufficient documentation

## 2011-03-13 DIAGNOSIS — Z79899 Other long term (current) drug therapy: Secondary | ICD-10-CM | POA: Insufficient documentation

## 2011-03-13 DIAGNOSIS — E039 Hypothyroidism, unspecified: Secondary | ICD-10-CM | POA: Insufficient documentation

## 2011-03-13 DIAGNOSIS — K5289 Other specified noninfective gastroenteritis and colitis: Secondary | ICD-10-CM | POA: Insufficient documentation

## 2011-03-13 DIAGNOSIS — Z86718 Personal history of other venous thrombosis and embolism: Secondary | ICD-10-CM | POA: Insufficient documentation

## 2011-03-13 DIAGNOSIS — N390 Urinary tract infection, site not specified: Principal | ICD-10-CM | POA: Insufficient documentation

## 2011-03-13 DIAGNOSIS — K219 Gastro-esophageal reflux disease without esophagitis: Secondary | ICD-10-CM | POA: Insufficient documentation

## 2011-03-13 DIAGNOSIS — Z7902 Long term (current) use of antithrombotics/antiplatelets: Secondary | ICD-10-CM | POA: Insufficient documentation

## 2011-03-13 DIAGNOSIS — N182 Chronic kidney disease, stage 2 (mild): Secondary | ICD-10-CM | POA: Insufficient documentation

## 2011-03-13 DIAGNOSIS — B9689 Other specified bacterial agents as the cause of diseases classified elsewhere: Secondary | ICD-10-CM | POA: Insufficient documentation

## 2011-03-13 DIAGNOSIS — E119 Type 2 diabetes mellitus without complications: Secondary | ICD-10-CM | POA: Insufficient documentation

## 2011-03-13 DIAGNOSIS — Z7982 Long term (current) use of aspirin: Secondary | ICD-10-CM | POA: Insufficient documentation

## 2011-03-13 DIAGNOSIS — E785 Hyperlipidemia, unspecified: Secondary | ICD-10-CM | POA: Insufficient documentation

## 2011-03-13 DIAGNOSIS — T4275XA Adverse effect of unspecified antiepileptic and sedative-hypnotic drugs, initial encounter: Secondary | ICD-10-CM | POA: Insufficient documentation

## 2011-03-13 DIAGNOSIS — E860 Dehydration: Secondary | ICD-10-CM | POA: Insufficient documentation

## 2011-03-13 DIAGNOSIS — R9431 Abnormal electrocardiogram [ECG] [EKG]: Secondary | ICD-10-CM | POA: Insufficient documentation

## 2011-03-13 DIAGNOSIS — K5909 Other constipation: Secondary | ICD-10-CM | POA: Insufficient documentation

## 2011-03-13 DIAGNOSIS — Z950 Presence of cardiac pacemaker: Secondary | ICD-10-CM | POA: Insufficient documentation

## 2011-03-13 DIAGNOSIS — R310 Gross hematuria: Secondary | ICD-10-CM | POA: Insufficient documentation

## 2011-03-13 HISTORY — DX: Heart failure, unspecified: I50.9

## 2011-03-13 HISTORY — DX: Essential (primary) hypertension: I10

## 2011-03-13 LAB — BASIC METABOLIC PANEL
CO2: 29 mEq/L (ref 19–32)
Calcium: 9.6 mg/dL (ref 8.4–10.5)
Chloride: 102 mEq/L (ref 96–112)
GFR calc Af Amer: 56 mL/min — ABNORMAL LOW (ref >60)
Glucose, Bld: 96 mg/dL (ref 70–99)
Sodium: 138 mEq/L (ref 135–145)

## 2011-03-13 LAB — DIFFERENTIAL
Lymphocytes Relative: 27 % (ref 12–46)
Monocytes Absolute: 0.5 10*3/uL (ref 0.1–1.0)
Monocytes Relative: 10 % (ref 3–12)
Neutro Abs: 2.9 10*3/uL (ref 1.7–7.7)
Neutrophils Relative %: 60 % (ref 43–77)

## 2011-03-13 LAB — CBC
HCT: 37.5 % (ref 36.0–46.0)
Hemoglobin: 11.9 g/dL — ABNORMAL LOW (ref 12.0–15.0)
MCH: 28.8 pg (ref 26.0–34.0)
MCHC: 31.7 g/dL (ref 30.0–36.0)
RBC: 4.13 MIL/uL (ref 3.87–5.11)

## 2011-03-13 LAB — URINALYSIS, ROUTINE W REFLEX MICROSCOPIC
Glucose, UA: NEGATIVE mg/dL
Ketones, ur: 15 mg/dL — AB
Specific Gravity, Urine: 1.015 (ref 1.005–1.030)
pH: 6 (ref 5.0–8.0)

## 2011-03-13 LAB — PROTIME-INR: INR: 1.18 (ref 0.00–1.49)

## 2011-03-13 MED ORDER — IOHEXOL 300 MG/ML  SOLN
100.0000 mL | Freq: Once | INTRAMUSCULAR | Status: AC | PRN
  Administered 2011-03-13: 100 mL via INTRAVENOUS

## 2011-03-14 LAB — BASIC METABOLIC PANEL
BUN: 11 mg/dL (ref 6–23)
CO2: 29 mEq/L (ref 19–32)
Chloride: 103 mEq/L (ref 96–112)
GFR calc non Af Amer: 52 mL/min — ABNORMAL LOW (ref >60)
Glucose, Bld: 122 mg/dL — ABNORMAL HIGH (ref 70–99)
Potassium: 3.7 mEq/L (ref 3.5–5.1)
Sodium: 137 mEq/L (ref 135–145)

## 2011-03-14 LAB — CBC
HCT: 35.6 % — ABNORMAL LOW (ref 36.0–46.0)
Hemoglobin: 11.4 g/dL — ABNORMAL LOW (ref 12.0–15.0)
MCV: 90.4 fL (ref 78.0–100.0)
RDW: 14 % (ref 11.5–15.5)
WBC: 5.5 10*3/uL (ref 4.0–10.5)

## 2011-03-14 LAB — GLUCOSE, CAPILLARY: Glucose-Capillary: 115 mg/dL — ABNORMAL HIGH (ref 70–99)

## 2011-03-16 LAB — URINE CULTURE
Colony Count: >=100000
Culture  Setup Time: 201203191027

## 2011-03-17 NOTE — H&P (Signed)
[name]             ACCOUNT NO.:  [id number]  MEDICAL RECORD NO.:  [id number]           PATIENT TYPE:  E  LOCATION:  WLED                         FACILITY:  [facility]  PHYSICIAN:  Hollice Espy, M.D.DATE OF BIRTH:  03-21-1925  DATE OF ADMISSION:  03/13/2011 DATE OF DISCHARGE:                             HISTORY & PHYSICAL   ATTENDING PHYSICIAN:  Hollice Espy, [name]  PRIMARY CARE PHYSICIAN:  [name], [name]  CARDIOLOGIST:  Thereasa Solo. Little, [name]  CHIEF COMPLAINT:  Bleeding from vagina.  HISTORY OF PRESENT ILLNESS:  The patient is an 85-year African-American female, just discharged from the hospitalist service about a month ago for humeral and femur fracture, status post repair.  She resides in a skilled nursing facility and was sent over after she was reported vaginal bleeding.  She was a decent historian.  When she was brought in the emergency room, she was evaluated, she was found to have a normal white count, relatively normal hemoglobin, and stable vitals.  Upon further exam,  it was found actually that she was not having bleeding from her vagina but from her urethra.  Labs were sent and she was found to have again a normal white count but it was noted to have a large UTI with gross hematuria.  CT of the abdomen and pelvis was done, both because of complaints of some vague abdominal pain as well as to evaluate for signs of any renal mass or bladder mass.  No hydronephrosis was noted and was noted stable urinary functio.  No bladder mass but signs seen of mild wall thickening within the transverse colon concerning for colitis, either felt to be infectious or ischemic and large volume of stool in the rectosigmoid colon.  With these findings, it was felt that patient should come in for further evaluation.  When I saw the patient, she was doing okay.  She was about to started her lunch but she said she was having mild abdominal pain.  She denied  any headaches, no vision changes, no dysphagia.  No chest pain, palpitations, shortness of breath, wheeze, cough.  No diarrhea.  She did note some blood which initially thought was she was having a period again.  She did also note some abdominal pain as described above and has problems of chronic constipation.  REVIEW OF SYSTEMS:  Otherwise negative.  PAST MEDICAL HISTORY:  Includes, 1. Discharge a month ago for a humeral fracture and subcapital     fracture of the proximal left femur, status post repair. 2. History of CAD, status post stent placement in 2008, secondary to     heart block, status post pacer. 3. Paroxysmal Afib. 4. Chronic diastolic heart failure, grade 1. 5. Preserved ejection fraction. 6. Stage II chronic kidney disease. 7. Non-insulin-dependent diabetes mellitus. 8. Hypothyroidism. 9. Hyperlipidemia.  MEDICATIONS:  The patient is on, 1. Vesicare 10. 2. Simvastatin 20. 3. Senokot 1 tablet p.o. b.i.d. 4. Plavix 75. 5. Nexium 40. 6. Synthroid 125. 7. Imdur 30. 8. Glipizide 5. 9. Dilaudid 4 q.6h. p.r.n. 10.Os-Cal D 200 p.o. b.i.d. 11.Aspirin 81. 12.Artificial tears both drops every 2 hours  as needed.  ALLERGIES:  She is allergic to PENICILLIN and LIPITOR, although it is noted that she is on simvastatin.  She is also reportedly a diabetic but is not on any diabetes medications, although it is of note that she is on glipizide prior to on her last discharge summary, we need to confirm that medication with pharmacy.  One point she was also on amiodarone, labetalol, and diltiazem, none of which on her pharmacy med rec form, so again will confirm these medications as well.  SOCIAL HISTORY:  No tobacco, alcohol, or drug use.  Currently, resides in a skilled nursing facility.  FAMILY HISTORY:  Noncontributory.  PHYSICAL EXAMINATION:  VITAL SIGNS:  The patient's vitals on admission; blood pressure 168/92, respirations 18, heart rate 70, afebrile. GENERAL:   She is alert and oriented x2, in no apparent distress. HEENT:  Normocephalic and atraumatic.  Her mucous membranes are dry. She has no carotid bruits. HEART:  Regular rate and rhythm.  S1-S2.  Occasional ectopic beat, 2/6 systolic ejection murmur. LUNGS:  Clear to auscultation bilaterally. ABDOMEN:  Soft.  Generalized nonspecific tenderness, especially upper quadrant.  Hypoactive bowel sounds. EXTREMITIES:  Showed no clubbing, cyanosis, or edema.  LABORATORY DATA:  Sodium 138, potassium 3.5, chloride 102, bicarb 19, BUN 17, creatinine 1.13, glucose 96.  White count 4.8, H and H 12 and 38, MCV 90, platelet count 199,000.  Urinalysis notes too numerous to count red blood cells with a large bilirubin, 15 ketones, large blood, 100 protein, nitrite positive, large leukocytes, and did not able to check bacteria, white cells field obscured by red blood cells.  Her Hemoccult was noted to be negative.  ASSESSMENT/PLAN: 1. UTI.  Will treat with Cipro.  This could the cause of hematuria. 2. Hematuria.  Will go ahead and hold her Plavix for right now.  Do     not think there is no evidence of this time of bladder mass or     renal mass, I do not have any other reason to think it is     something, otherwise. 3. Colitis, ischemic versus infectious, possibly it could be     infectious, although given the fact she has no white count, no     shift but will go ahead and add Flagyl to her Cipro to cover for     this ischemia.  She is on aspirin and Plavix, again go ahead and     hold the Plavix if hematuria.  I doubt very much that she is doing     up and it arterial embolic clot to the mesenteric artery given the     fact that she does not look to be septic or having any type of dead     bowel. 4. Hypothyroidism.  Continue Synthroid. 5. Constipation from chronic narcotic, increase her bowel regimen. 6. Diabetes mellitus, will confirm that she is on glipizide.     Hollice Espy,  [name]     SKK/MEDQ  D:  03/13/2011  T:  03/13/2011  Job:  [id number]  cc:   [name], [name] Fax: [phone]  Thereasa Solo. Little, [name] Fax: [phone]  Electronically Signed by Virginia Rochester [name] on 03/17/2011 09:54:32 AM

## 2011-03-17 NOTE — Discharge Summary (Signed)
[name], [name]             ACCOUNT NO.:  [id number]  MEDICAL RECORD NO.:  [id number]           PATIENT TYPE:  O  LOCATION:  1537                         FACILITY:  [facility]  PHYSICIAN:  Hollice Espy, M.D.DATE OF BIRTH:  [birth year]  DATE OF ADMISSION:  03/13/2011 DATE OF DISCHARGE:  03/14/2011                        DISCHARGE SUMMARY - REFERRING   PRIMARY CARE PHYSICIAN:  [name], [name]  DISCHARGE DIAGNOSES: 1. Urinary tract infection with gram-negative rods, suspected     Escherichia coli. 2. Hematuria, resolved, felt to be secondary to urinary tract     infection. 3. Colitis felt to be secondary to more infectious process rather than     ischemic. 4. History of mild dementia. 5. History of coronary artery disease. 6. Diabetes mellitus. 7. Hypothyroidism. 8. History of hypertension. 9. Constipation secondary to chronic narcotic medications.  DISCHARGE MEDICATIONS:  New medicines: 1. Cipro 500 p.o. b.i.d. x6 days. 2. Flagyl 500 mg p.o. q.8h. x7 days. 3. MiraLax 17 g p.o. daily.  The patient will continue on: 1. Artificial Tears 1 drop both eyes every 2 hours as needed. 2. Aspirin 81 p.o. daily. 3. Os-Cal D 600/200 one p.o. b.i.d. 4. Dilaudid 4 mg p.o. q.6h. p.r.n. 5. Glipizide 5 p.o. daily. 6. Imdur 30 p.o. daily. 7. Synthroid 125 mcg p.o. daily. 8. Nexium 40 p.o. daily. 9. Plavix 75 p.o. daily. 10.Senokot 1 tablet p.o. b.i.d. 11.Zocor 20 p.o. daily. 12.VESIcare 10 p.o. daily.  DISCHARGE DIET:  Carb-modified heart-healthy.  DISPOSITION:  Improved.  ACTIVITY:  Slowly increase.  HOSPITAL COURSE:  The patient is an 77 year old African American female with past history of hypertension and CAD who was discharged a month ago from the hospitalist service for a femur fracture repair who presented yesterday to the emergency room with initially what was thought to be vaginal bleeding but turned out to be bleeding from the urethral tract. She was  found to have a large UTI as well as hematuria, and upon further examination with the CT of the abdomen and pelvis, noted colitis.  She was treated with IV antibiotics.  Her Plavix was held, and within 24 hours, she was feeling comfortable, tolerating p.o. with no abdominal pain.  She has had no white count, no fever.  Her vital signs are stable.  Plan will be to discharge her back to a skilled nursing facility on p.o. antibiotics Cipro and Flagyl to both cover for UTI as well as for gut coverage for presumed infectious colitis.  Her other medical issues were stable during her hospitalization.  It was also noted on CT that she had a large amount of stool likely from her chronic narcotic use.  We are adding daily MiraLax to her regimen.  She received a Fleet enema and had a large bowel movement also which is making her feel better.  Rest of her medical issues were stable.  DISPOSITION:  Improved.  She will follow up with Dr. Marisue Brooklyn, her PCP, in the next 1 month's time.  I am going to restart her Plavix now that her hematuria has resolved as well.  Please note that her hematuria, UTI, and colitis were  all present on admission.  After a discussion with both the patient and her daughter, confirmed that she is a DNR.     Hollice Espy, [name]     SKK/MEDQ  D:  03/14/2011  T:  03/14/2011  Job:  [id number]  cc:   [name], [name] Fax: [phone]  Thereasa Solo. Little, [name] Fax: [phone]  [facility]  Electronically Signed by Virginia Rochester [name] on 03/17/2011 09:54:34 AM

## 2011-03-28 LAB — GLUCOSE, CAPILLARY
Glucose-Capillary: 136 mg/dL — ABNORMAL HIGH (ref 70–99)
Glucose-Capillary: 140 mg/dL — ABNORMAL HIGH (ref 70–99)
Glucose-Capillary: 165 mg/dL — ABNORMAL HIGH (ref 70–99)
Glucose-Capillary: 180 mg/dL — ABNORMAL HIGH (ref 70–99)
Glucose-Capillary: 229 mg/dL — ABNORMAL HIGH (ref 70–99)

## 2011-03-29 LAB — BASIC METABOLIC PANEL
BUN: 13 mg/dL (ref 6–23)
BUN: 19 mg/dL (ref 6–23)
CO2: 30 mEq/L (ref 19–32)
CO2: 31 mEq/L (ref 19–32)
CO2: 31 mEq/L (ref 19–32)
Calcium: 8.9 mg/dL (ref 8.4–10.5)
Chloride: 101 mEq/L (ref 96–112)
Chloride: 103 mEq/L (ref 96–112)
Chloride: 99 mEq/L (ref 96–112)
Creatinine, Ser: 1.22 mg/dL — ABNORMAL HIGH (ref 0.4–1.2)
Creatinine, Ser: 1.3 mg/dL — ABNORMAL HIGH (ref 0.4–1.2)
GFR calc Af Amer: 47 mL/min — ABNORMAL LOW (ref >60)
GFR calc Af Amer: 51 mL/min — ABNORMAL LOW (ref >60)
GFR calc Af Amer: 51 mL/min — ABNORMAL LOW (ref >60)
GFR calc Af Amer: 53 mL/min — ABNORMAL LOW (ref >60)
GFR calc non Af Amer: 42 mL/min — ABNORMAL LOW (ref >60)
GFR calc non Af Amer: 44 mL/min — ABNORMAL LOW (ref >60)
Potassium: 3.1 mEq/L — ABNORMAL LOW (ref 3.5–5.1)
Sodium: 137 mEq/L (ref 135–145)
Sodium: 140 mEq/L (ref 135–145)

## 2011-03-29 LAB — DIFFERENTIAL
Basophils Relative: 0 % (ref 0–1)
Eosinophils Absolute: 0.1 10*3/uL (ref 0.0–0.7)
Eosinophils Relative: 2 % (ref 0–5)
Monocytes Relative: 8 % (ref 3–12)
Neutrophils Relative %: 66 % (ref 43–77)

## 2011-03-29 LAB — POCT CARDIAC MARKERS
CKMB, poc: 1.5 ng/mL (ref 1.0–8.0)
Myoglobin, poc: 123 ng/mL (ref 12–200)
Troponin i, poc: <0.05 ng/mL (ref 0.00–0.09)
Troponin i, poc: <0.05 ng/mL (ref 0.00–0.09)

## 2011-03-29 LAB — GLUCOSE, CAPILLARY
Glucose-Capillary: 141 mg/dL — ABNORMAL HIGH (ref 70–99)
Glucose-Capillary: 164 mg/dL — ABNORMAL HIGH (ref 70–99)
Glucose-Capillary: 240 mg/dL — ABNORMAL HIGH (ref 70–99)

## 2011-03-29 LAB — CBC
HCT: 35.3 % — ABNORMAL LOW (ref 36.0–46.0)
HCT: 36.1 % (ref 36.0–46.0)
Hemoglobin: 12 g/dL (ref 12.0–15.0)
Hemoglobin: 12.2 g/dL (ref 12.0–15.0)
Hemoglobin: 13.3 g/dL (ref 12.0–15.0)
MCHC: 33.7 g/dL (ref 30.0–36.0)
MCHC: 33.9 g/dL (ref 30.0–36.0)
MCV: 89.1 fL (ref 78.0–100.0)
MCV: 89.4 fL (ref 78.0–100.0)
MCV: 89.8 fL (ref 78.0–100.0)
Platelets: 119 10*3/uL — ABNORMAL LOW (ref 150–400)
Platelets: 122 10*3/uL — ABNORMAL LOW (ref 150–400)
RBC: 3.97 MIL/uL (ref 3.87–5.11)
RBC: 4.02 MIL/uL (ref 3.87–5.11)
RBC: 4.03 MIL/uL (ref 3.87–5.11)
RBC: 4.4 MIL/uL (ref 3.87–5.11)
RDW: 14.1 % (ref 11.5–15.5)
WBC: 4.4 10*3/uL (ref 4.0–10.5)
WBC: 5.4 10*3/uL (ref 4.0–10.5)

## 2011-03-29 LAB — COMPREHENSIVE METABOLIC PANEL
ALT: 10 U/L (ref 0–35)
Alkaline Phosphatase: 64 U/L (ref 39–117)
CO2: 30 mEq/L (ref 19–32)
GFR calc non Af Amer: 41 mL/min — ABNORMAL LOW (ref >60)
Glucose, Bld: 123 mg/dL — ABNORMAL HIGH (ref 70–99)
Potassium: 3.6 mEq/L (ref 3.5–5.1)
Sodium: 140 mEq/L (ref 135–145)

## 2011-03-29 LAB — PROTIME-INR
INR: 1.11 (ref 0.00–1.49)
Prothrombin Time: 14.2 seconds (ref 11.6–15.2)

## 2011-03-29 LAB — HEMOGLOBIN A1C
Hgb A1c MFr Bld: 7.1 % — ABNORMAL HIGH (ref 4.6–6.1)
Mean Plasma Glucose: 157 mg/dL

## 2011-03-30 ENCOUNTER — Observation Stay (HOSPITAL_COMMUNITY)
Admission: EM | Admit: 2011-03-30 | Discharge: 2011-04-03 | Disposition: A | Discharge status: Skilled Nursing Facility | Payer: Medicare Other | Attending: Internal Medicine | Admitting: Internal Medicine

## 2011-03-30 DIAGNOSIS — B961 Klebsiella pneumoniae [K. pneumoniae] as the cause of diseases classified elsewhere: Secondary | ICD-10-CM | POA: Insufficient documentation

## 2011-03-30 DIAGNOSIS — E876 Hypokalemia: Secondary | ICD-10-CM | POA: Insufficient documentation

## 2011-03-30 DIAGNOSIS — I4891 Unspecified atrial fibrillation: Secondary | ICD-10-CM | POA: Insufficient documentation

## 2011-03-30 DIAGNOSIS — Z79899 Other long term (current) drug therapy: Secondary | ICD-10-CM | POA: Insufficient documentation

## 2011-03-30 DIAGNOSIS — M6281 Muscle weakness (generalized): Secondary | ICD-10-CM | POA: Insufficient documentation

## 2011-03-30 DIAGNOSIS — G894 Chronic pain syndrome: Secondary | ICD-10-CM | POA: Insufficient documentation

## 2011-03-30 DIAGNOSIS — Z950 Presence of cardiac pacemaker: Secondary | ICD-10-CM | POA: Insufficient documentation

## 2011-03-30 DIAGNOSIS — R339 Retention of urine, unspecified: Secondary | ICD-10-CM | POA: Insufficient documentation

## 2011-03-30 DIAGNOSIS — N179 Acute kidney failure, unspecified: Secondary | ICD-10-CM | POA: Insufficient documentation

## 2011-03-30 DIAGNOSIS — F0390 Unspecified dementia without behavioral disturbance: Secondary | ICD-10-CM | POA: Insufficient documentation

## 2011-03-30 DIAGNOSIS — N390 Urinary tract infection, site not specified: Secondary | ICD-10-CM | POA: Insufficient documentation

## 2011-03-30 DIAGNOSIS — E860 Dehydration: Secondary | ICD-10-CM | POA: Insufficient documentation

## 2011-03-30 DIAGNOSIS — E785 Hyperlipidemia, unspecified: Secondary | ICD-10-CM | POA: Insufficient documentation

## 2011-03-30 DIAGNOSIS — R4182 Altered mental status, unspecified: Principal | ICD-10-CM | POA: Insufficient documentation

## 2011-03-30 DIAGNOSIS — I10 Essential (primary) hypertension: Secondary | ICD-10-CM | POA: Insufficient documentation

## 2011-03-30 DIAGNOSIS — E1169 Type 2 diabetes mellitus with other specified complication: Secondary | ICD-10-CM | POA: Insufficient documentation

## 2011-03-30 DIAGNOSIS — K219 Gastro-esophageal reflux disease without esophagitis: Secondary | ICD-10-CM | POA: Insufficient documentation

## 2011-03-30 DIAGNOSIS — I2510 Atherosclerotic heart disease of native coronary artery without angina pectoris: Secondary | ICD-10-CM | POA: Insufficient documentation

## 2011-03-30 DIAGNOSIS — I509 Heart failure, unspecified: Secondary | ICD-10-CM | POA: Insufficient documentation

## 2011-03-30 DIAGNOSIS — E039 Hypothyroidism, unspecified: Secondary | ICD-10-CM | POA: Insufficient documentation

## 2011-03-30 LAB — URINALYSIS, ROUTINE W REFLEX MICROSCOPIC
Glucose, UA: NEGATIVE mg/dL
Specific Gravity, Urine: 1.018 (ref 1.005–1.030)
Urobilinogen, UA: 0.2 mg/dL (ref 0.0–1.0)

## 2011-03-30 LAB — CBC
HCT: 36 % (ref 36.0–46.0)
MCV: 87 fL (ref 78.0–100.0)
Platelets: 142 10*3/uL — ABNORMAL LOW (ref 150–400)
Platelets: 154 10*3/uL (ref 150–400)
RBC: 4.3 MIL/uL (ref 3.87–5.11)
RDW: 14.4 % (ref 11.5–15.5)
WBC: 4.3 10*3/uL (ref 4.0–10.5)
WBC: 7.3 10*3/uL (ref 4.0–10.5)

## 2011-03-30 LAB — DIFFERENTIAL
Basophils Absolute: 0 10*3/uL (ref 0.0–0.1)
Basophils Absolute: 0 10*3/uL (ref 0.0–0.1)
Basophils Relative: 0 % (ref 0–1)
Basophils Relative: 0 % (ref 0–1)
Eosinophils Absolute: 0.1 10*3/uL (ref 0.0–0.7)
Eosinophils Absolute: 0.2 10*3/uL (ref 0.0–0.7)
Eosinophils Relative: 3 % (ref 0–5)
Lymphs Abs: 1.1 10*3/uL (ref 0.7–4.0)
Lymphs Abs: 1.4 10*3/uL (ref 0.7–4.0)
Neutrophils Relative %: 60 % (ref 43–77)
Neutrophils Relative %: 69 % (ref 43–77)

## 2011-03-30 LAB — URINE MICROSCOPIC-ADD ON

## 2011-03-30 LAB — COMPREHENSIVE METABOLIC PANEL
AST: 54 U/L — ABNORMAL HIGH (ref 0–37)
Albumin: 3.5 g/dL (ref 3.5–5.2)
Alkaline Phosphatase: 73 U/L (ref 39–117)
Chloride: 97 mEq/L (ref 96–112)
GFR calc Af Amer: 27 mL/min — ABNORMAL LOW (ref >60)
Potassium: 3.6 mEq/L (ref 3.5–5.1)
Sodium: 135 mEq/L (ref 135–145)
Total Bilirubin: 0.8 mg/dL (ref 0.3–1.2)
Total Protein: 7 g/dL (ref 6.0–8.3)

## 2011-03-31 LAB — CBC
Hemoglobin: 12.9 g/dL (ref 12.0–15.0)
MCH: 28.6 pg (ref 26.0–34.0)
MCHC: 31.4 g/dL (ref 30.0–36.0)
MCHC: 33.1 g/dL (ref 30.0–36.0)
MCHC: 33.4 g/dL (ref 30.0–36.0)
MCV: 91.2 fL (ref 78.0–100.0)
MCV: 86.8 fL (ref 78.0–100.0)
Platelets: 151 10*3/uL (ref 150–400)
Platelets: 140 10*3/uL — ABNORMAL LOW (ref 150–400)
RBC: 3.88 MIL/uL (ref 3.87–5.11)
RBC: 4.48 MIL/uL (ref 3.87–5.11)
RDW: 14.8 % (ref 11.5–15.5)
WBC: 4.1 10*3/uL (ref 4.0–10.5)

## 2011-03-31 LAB — BASIC METABOLIC PANEL
BUN: 21 mg/dL (ref 6–23)
BUN: 31 mg/dL — ABNORMAL HIGH (ref 6–23)
CO2: 26 mEq/L (ref 19–32)
CO2: 28 mEq/L (ref 19–32)
Calcium: 9.3 mg/dL (ref 8.4–10.5)
Calcium: 9.4 mg/dL (ref 8.4–10.5)
Chloride: 103 mEq/L (ref 96–112)
Creatinine, Ser: 1.21 mg/dL — ABNORMAL HIGH (ref 0.4–1.2)
GFR calc Af Amer: 52 mL/min — ABNORMAL LOW (ref >60)
Glucose, Bld: 126 mg/dL — ABNORMAL HIGH (ref 70–99)
Sodium: 142 mEq/L (ref 135–145)

## 2011-03-31 LAB — HEMOGLOBIN A1C
Hgb A1c MFr Bld: 5.9 % — ABNORMAL HIGH (ref <5.7)
Mean Plasma Glucose: 123 mg/dL — ABNORMAL HIGH (ref <117)

## 2011-03-31 LAB — GLUCOSE, CAPILLARY
Glucose-Capillary: 99 mg/dL (ref 70–99)
Glucose-Capillary: 66 mg/dL — ABNORMAL LOW (ref 70–99)
Glucose-Capillary: 142 mg/dL — ABNORMAL HIGH (ref 70–99)
Glucose-Capillary: 163 mg/dL — ABNORMAL HIGH (ref 70–99)
Glucose-Capillary: 163 mg/dL — ABNORMAL HIGH (ref 70–99)
Glucose-Capillary: 210 mg/dL — ABNORMAL HIGH (ref 70–99)
Glucose-Capillary: 85 mg/dL (ref 70–99)
Glucose-Capillary: 74 mg/dL (ref 70–99)

## 2011-03-31 LAB — DIFFERENTIAL
Basophils Absolute: 0.1 10*3/uL (ref 0.0–0.1)
Basophils Relative: 1 % (ref 0–1)
Lymphocytes Relative: 30 % (ref 12–46)
Monocytes Absolute: 0.5 10*3/uL (ref 0.1–1.0)
Neutro Abs: 2.2 10*3/uL (ref 1.7–7.7)

## 2011-03-31 LAB — COMPREHENSIVE METABOLIC PANEL
AST: 45 U/L — ABNORMAL HIGH (ref 0–37)
Albumin: 3.1 g/dL — ABNORMAL LOW (ref 3.5–5.2)
BUN: 23 mg/dL (ref 6–23)
Calcium: 9.2 mg/dL (ref 8.4–10.5)
Creatinine, Ser: 1.72 mg/dL — ABNORMAL HIGH (ref 0.4–1.2)
GFR calc Af Amer: 34 mL/min — ABNORMAL LOW (ref >60)
Total Bilirubin: 0.6 mg/dL (ref 0.3–1.2)
Total Protein: 6.2 g/dL (ref 6.0–8.3)

## 2011-03-31 LAB — CARDIAC PANEL(CRET KIN+CKTOT+MB+TROPI)
CK, MB: 2.5 ng/mL (ref 0.3–4.0)
Total CK: 177 U/L (ref 7–177)
Troponin I: 0.02 ng/mL (ref 0.00–0.06)

## 2011-03-31 LAB — URINALYSIS, ROUTINE W REFLEX MICROSCOPIC
Ketones, ur: NEGATIVE mg/dL
Leukocytes, UA: NEGATIVE
Nitrite: POSITIVE — AB
Protein, ur: 30 mg/dL — AB
pH: 7.5 (ref 5.0–8.0)

## 2011-03-31 LAB — T4, FREE: Free T4: 0.84 ng/dL (ref 0.80–1.80)

## 2011-03-31 LAB — PROTIME-INR: Prothrombin Time: 14.3 seconds (ref 11.6–15.2)

## 2011-03-31 LAB — CK TOTAL AND CKMB (NOT AT ARMC)
CK, MB: 3 ng/mL (ref 0.3–4.0)
Relative Index: 1.4 (ref 0.0–2.5)

## 2011-03-31 LAB — POCT CARDIAC MARKERS
CKMB, poc: 1.9 ng/mL (ref 1.0–8.0)
Myoglobin, poc: 158 ng/mL (ref 12–200)
Troponin i, poc: <0.05 ng/mL (ref 0.00–0.09)

## 2011-03-31 LAB — HEPATIC FUNCTION PANEL
Bilirubin, Direct: <0.1 mg/dL (ref 0.0–0.3)
Total Bilirubin: 0.8 mg/dL (ref 0.3–1.2)

## 2011-03-31 LAB — TYPE AND SCREEN
ABO/RH(D): A POS
Antibody Screen: NEGATIVE

## 2011-03-31 LAB — LIPID PANEL
Cholesterol: 161 mg/dL (ref 0–200)
LDL Cholesterol: 86 mg/dL (ref 0–99)

## 2011-03-31 LAB — ABO/RH: ABO/RH(D): A POS

## 2011-03-31 LAB — URINE MICROSCOPIC-ADD ON

## 2011-03-31 LAB — TSH: TSH: 4.426 u[IU]/mL (ref 0.350–4.500)

## 2011-04-01 ENCOUNTER — Observation Stay (HOSPITAL_COMMUNITY): Payer: Medicare Other

## 2011-04-01 LAB — GLUCOSE, CAPILLARY: Glucose-Capillary: 71 mg/dL (ref 70–99)

## 2011-04-01 LAB — BASIC METABOLIC PANEL
BUN: 15 mg/dL (ref 6–23)
Calcium: 9.2 mg/dL (ref 8.4–10.5)
Creatinine, Ser: 1.15 mg/dL (ref 0.4–1.2)
GFR calc non Af Amer: 45 mL/min — ABNORMAL LOW (ref >60)
Glucose, Bld: 91 mg/dL (ref 70–99)
Sodium: 140 mEq/L (ref 135–145)

## 2011-04-02 ENCOUNTER — Observation Stay (HOSPITAL_COMMUNITY): Payer: Medicare Other

## 2011-04-02 LAB — BASIC METABOLIC PANEL
Calcium: 8.9 mg/dL (ref 8.4–10.5)
GFR calc non Af Amer: 55 mL/min — ABNORMAL LOW (ref >60)
Potassium: 3.4 mEq/L — ABNORMAL LOW (ref 3.5–5.1)
Sodium: 137 mEq/L (ref 135–145)

## 2011-04-02 LAB — GLUCOSE, CAPILLARY
Glucose-Capillary: 105 mg/dL — ABNORMAL HIGH (ref 70–99)
Glucose-Capillary: 143 mg/dL — ABNORMAL HIGH (ref 70–99)
Glucose-Capillary: 82 mg/dL (ref 70–99)

## 2011-04-03 LAB — CBC
Hemoglobin: 11.5 g/dL — ABNORMAL LOW (ref 12.0–15.0)
MCH: 28.5 pg (ref 26.0–34.0)
MCHC: 31.5 g/dL (ref 30.0–36.0)
MCV: 90.3 fL (ref 78.0–100.0)
Platelets: 158 10*3/uL (ref 150–400)

## 2011-04-03 LAB — BASIC METABOLIC PANEL
BUN: 8 mg/dL (ref 6–23)
CO2: 27 mEq/L (ref 19–32)
Calcium: 9.2 mg/dL (ref 8.4–10.5)
Chloride: 109 mEq/L (ref 96–112)
Creatinine, Ser: 0.91 mg/dL (ref 0.4–1.2)
GFR calc Af Amer: >60 mL/min (ref >60)

## 2011-04-03 LAB — URINE CULTURE
Colony Count: >=100000
Culture  Setup Time: 201204060120

## 2011-04-03 LAB — GLUCOSE, CAPILLARY

## 2011-04-06 LAB — CK TOTAL AND CKMB (NOT AT ARMC)
CK, MB: 1.2 ng/mL (ref 0.3–4.0)
CK, MB: 1.5 ng/mL (ref 0.3–4.0)
Relative Index: 1.3 (ref 0.0–2.5)

## 2011-04-06 LAB — HEMOGLOBIN A1C
Hgb A1c MFr Bld: 6.5 % — ABNORMAL HIGH (ref 4.6–6.1)
Mean Plasma Glucose: 140 mg/dL
Mean Plasma Glucose: 143 mg/dL

## 2011-04-06 LAB — BASIC METABOLIC PANEL
BUN: 43 mg/dL — ABNORMAL HIGH (ref 6–23)
BUN: 49 mg/dL — ABNORMAL HIGH (ref 6–23)
BUN: 65 mg/dL — ABNORMAL HIGH (ref 6–23)
BUN: 94 mg/dL — ABNORMAL HIGH (ref 6–23)
CO2: 26 mEq/L (ref 19–32)
CO2: 29 mEq/L (ref 19–32)
CO2: 30 mEq/L (ref 19–32)
CO2: 30 mEq/L (ref 19–32)
Calcium: 9.2 mg/dL (ref 8.4–10.5)
Calcium: 9.7 mg/dL (ref 8.4–10.5)
Calcium: 9.9 mg/dL (ref 8.4–10.5)
Chloride: 93 mEq/L — ABNORMAL LOW (ref 96–112)
Chloride: 95 mEq/L — ABNORMAL LOW (ref 96–112)
Chloride: 99 mEq/L (ref 96–112)
GFR calc Af Amer: 24 mL/min — ABNORMAL LOW (ref >60)
GFR calc non Af Amer: 21 mL/min — ABNORMAL LOW (ref >60)
GFR calc non Af Amer: 26 mL/min — ABNORMAL LOW (ref >60)
GFR calc non Af Amer: 28 mL/min — ABNORMAL LOW (ref >60)
Glucose, Bld: 125 mg/dL — ABNORMAL HIGH (ref 70–99)
Glucose, Bld: 200 mg/dL — ABNORMAL HIGH (ref 70–99)
Glucose, Bld: 201 mg/dL — ABNORMAL HIGH (ref 70–99)
Glucose, Bld: 297 mg/dL — ABNORMAL HIGH (ref 70–99)
Potassium: 3.4 mEq/L — ABNORMAL LOW (ref 3.5–5.1)
Potassium: 3.8 mEq/L (ref 3.5–5.1)
Potassium: 4 mEq/L (ref 3.5–5.1)
Potassium: 4.2 mEq/L (ref 3.5–5.1)
Sodium: 130 mEq/L — ABNORMAL LOW (ref 135–145)
Sodium: 135 mEq/L (ref 135–145)
Sodium: 135 mEq/L (ref 135–145)
Sodium: 135 mEq/L (ref 135–145)
Sodium: 136 mEq/L (ref 135–145)

## 2011-04-06 LAB — CBC
HCT: 38.2 % (ref 36.0–46.0)
HCT: 38.2 % (ref 36.0–46.0)
HCT: 38.5 % (ref 36.0–46.0)
HCT: 39.3 % (ref 36.0–46.0)
Hemoglobin: 12.8 g/dL (ref 12.0–15.0)
Hemoglobin: 12.8 g/dL (ref 12.0–15.0)
Hemoglobin: 13.1 g/dL (ref 12.0–15.0)
Hemoglobin: 13.4 g/dL (ref 12.0–15.0)
MCHC: 33.4 g/dL (ref 30.0–36.0)
MCHC: 34.2 g/dL (ref 30.0–36.0)
MCHC: 34.2 g/dL (ref 30.0–36.0)
MCV: 87.6 fL (ref 78.0–100.0)
MCV: 88.1 fL (ref 78.0–100.0)
MCV: 88.7 fL (ref 78.0–100.0)
Platelets: 152 10*3/uL (ref 150–400)
Platelets: 163 10*3/uL (ref 150–400)
RBC: 4.39 MIL/uL (ref 3.87–5.11)
RDW: 13.2 % (ref 11.5–15.5)
RDW: 13.3 % (ref 11.5–15.5)
WBC: 4.8 10*3/uL (ref 4.0–10.5)

## 2011-04-06 LAB — DIFFERENTIAL
Basophils Absolute: 0 10*3/uL (ref 0.0–0.1)
Eosinophils Absolute: 0.1 10*3/uL (ref 0.0–0.7)
Eosinophils Relative: 1 % (ref 0–5)
Lymphocytes Relative: 21 % (ref 12–46)
Monocytes Absolute: 0.5 10*3/uL (ref 0.1–1.0)

## 2011-04-06 LAB — COMPREHENSIVE METABOLIC PANEL
Alkaline Phosphatase: 64 U/L (ref 39–117)
Alkaline Phosphatase: 72 U/L (ref 39–117)
BUN: 49 mg/dL — ABNORMAL HIGH (ref 6–23)
BUN: 95 mg/dL — ABNORMAL HIGH (ref 6–23)
CO2: 28 mEq/L (ref 19–32)
Chloride: 102 mEq/L (ref 96–112)
Chloride: 93 mEq/L — ABNORMAL LOW (ref 96–112)
Creatinine, Ser: 1.89 mg/dL — ABNORMAL HIGH (ref 0.4–1.2)
Creatinine, Ser: 3.11 mg/dL — ABNORMAL HIGH (ref 0.4–1.2)
GFR calc non Af Amer: 14 mL/min — ABNORMAL LOW (ref >60)
GFR calc non Af Amer: 26 mL/min — ABNORMAL LOW (ref >60)
Glucose, Bld: 144 mg/dL — ABNORMAL HIGH (ref 70–99)
Glucose, Bld: 273 mg/dL — ABNORMAL HIGH (ref 70–99)
Potassium: 4.2 mEq/L (ref 3.5–5.1)
Potassium: 4.5 mEq/L (ref 3.5–5.1)
Total Bilirubin: 0.6 mg/dL (ref 0.3–1.2)
Total Bilirubin: 0.6 mg/dL (ref 0.3–1.2)

## 2011-04-06 LAB — PROTIME-INR
INR: 1.1 (ref 0.00–1.49)
Prothrombin Time: 14.9 seconds (ref 11.6–15.2)

## 2011-04-06 LAB — GLUCOSE, CAPILLARY
Glucose-Capillary: 174 mg/dL — ABNORMAL HIGH (ref 70–99)
Glucose-Capillary: 192 mg/dL — ABNORMAL HIGH (ref 70–99)
Glucose-Capillary: 219 mg/dL — ABNORMAL HIGH (ref 70–99)
Glucose-Capillary: 357 mg/dL — ABNORMAL HIGH (ref 70–99)

## 2011-04-06 LAB — CARDIAC PANEL(CRET KIN+CKTOT+MB+TROPI): Total CK: 90 U/L (ref 7–177)

## 2011-04-06 LAB — URINALYSIS, ROUTINE W REFLEX MICROSCOPIC
Bilirubin Urine: NEGATIVE
Ketones, ur: NEGATIVE mg/dL
Nitrite: NEGATIVE
Protein, ur: NEGATIVE mg/dL
pH: 5 (ref 5.0–8.0)

## 2011-04-06 LAB — MAGNESIUM: Magnesium: 3.3 mg/dL — ABNORMAL HIGH (ref 1.5–2.5)

## 2011-04-06 LAB — POCT CARDIAC MARKERS
CKMB, poc: <1 ng/mL — ABNORMAL LOW (ref 1.0–8.0)
Troponin i, poc: <0.05 ng/mL (ref 0.00–0.09)

## 2011-04-06 LAB — TSH: TSH: 1.556 u[IU]/mL (ref 0.350–4.500)

## 2011-04-06 LAB — URINE MICROSCOPIC-ADD ON

## 2011-04-06 LAB — TROPONIN I: Troponin I: 0.01 ng/mL (ref 0.00–0.06)

## 2011-04-11 NOTE — H&P (Signed)
[name], [name]             ACCOUNT NO.:  [id number]  MEDICAL RECORD NO.:  [id number]           PATIENT TYPE:  O  LOCATION:  1302                         FACILITY:  [facility]  PHYSICIAN:  Lonia Blood, [name]      DATE OF BIRTH:  [birth year]  DATE OF ADMISSION:  03/30/2011 DATE OF DISCHARGE:                             HISTORY & PHYSICAL   PRIMARY CARE PHYSICIAN:  Lenon Curt. Chilton Si, [name]  PRESENTING COMPLAINT:  Altered mental status.  HISTORY OF PRESENT ILLNESS:  The patient is an 77 year old female who is a resident of [address] [facility] with history of dementia.  She was previously being followed by Dr. Marisue Brooklyn.  The patient was only recently discharged on March 20th from our service after admission with UTI.  At that time, it was found that she had gram- negative rods suspected to be E coli.  She also had some colitis.  She was apparently with her family today when they noticed that she was getting more confused.  She started shaking, seems like having chills; however, she was not febrile.  The patient also complained of generalized weakness and pain.  She was subsequently brought to the emergency room for further management.  In the nursing facility, the daughter noted that when she was shaking, she was grabbing the bed rail and moaning.  The patient was aware of what was going on and was apparently trying to get her daughter's attention because she was feeling bad.  She is now here responding to questions appropriately, but feels "very bad."  Her initial vitals in the nursing facility were normal with blood pressure 106/56, pulse 72.  Her capillary blood glucose was 105.  PAST MEDICAL HISTORY:  Significant for: 1. UTI recently with E coli. 2. History of atrial fibrillation, on medical cardioversion, not on     Coumadin. 3. Constipation. 4. History of colitis. 5. History of coronary artery disease. 6. History of type 2 diabetes. 7. GERD. 8.  History of generalized pain. 9. History of congestive heart failure. 10.Hyperlipidemia. 11.Hypertension. 12.Hypothyroidism. 13.History of urinary incontinence.  PAST SURGICAL HISTORY: 1. History of humeral fracture and subcapital fracture repair in     February 2010. 2. Status post stent placement in 2008 secondary to MI. 3. She is also status post pacemaker placement in 2008.  ALLERGIES: 1. LIPITOR. 2. PENICILLINS.  CURRENT MEDICATIONS:  Include amiodarone, diltiazem, isosorbide mononitrate, labetalol, glipizide, Plavix, Lasix, artificial tears, aspirin, calcium, Dilaudid, levothyroxine, MiraLax, Nexium, Phenergan, Senokot, simvastatin, and Vesicare.  SOCIAL HISTORY:  The patient currently lives in the nursing facility. No tobacco, alcohol, or IV drug use.  FAMILY HISTORY:  Noncontributory.  REVIEW OF SYSTEMS:  All systems reviewed currently are negative except per HPI.  PHYSICAL EXAMINATION:  VITAL SIGNS:  Temperature is 97.4, blood pressure 126/72, pulse 66, respiratory rate 16, sats 97% on room air. GENERAL:  She is awake, alert, communicating, but looks exhausted and tired. HEENT:  PERRL.  EOMI.  No pallor.  No jaundice.  No rhinorrhea. NECK:  Supple.  No JVD, no lymphadenopathy. RESPIRATORY:  She has good air entry bilaterally.  No wheezes,  no rales, no crackles. CARDIOVASCULAR SYSTEM:  The patient has S1, S2.  No audible murmur. ABDOMEN:  Soft, full, nontender with positive bowel sounds. EXTREMITIES:  No edema, cyanosis, or clubbing. SKIN EXAM:  No rashes.  No ulcers. MUSCULOSKELETAL:  No joint swelling or tenderness.  LABORATORY DATA:  Sodium 135, potassium 3.6, chloride 97, CO2 of 28, glucose 122, BUN 27, creatinine 2.09 with a calcium of 9.5.  The rest of the LFTs within normal.  Her white count is 7.3, hemoglobin 12.4, platelet count 154.  Initial cardiac enzymes are negative.  Urinalysis showed cloudy urine with trace ketones, trace blood, mild  proteinuria, and large leukocyte esterase.  Urine microscopy showed wbc's too numerous to count and a few bacteria, also some yeast..  ASSESSMENT:  This is an 77 year old female presenting with mild altered mental status and acute renal failure.  Her last creatinine on March 20th was 1.02 and is currently at 2.09 which is almost double.  There is no reported history of diarrhea, but she seems somewhat dehydrated.  PLAN:  Therefore: 1. Altered mental status.  This seems to have disappeared.  The     patient seems to be back to her baseline.  She also has a history     of mild dementia, so it could be some contributing delirium from     her dementia.  At this point, we will address her underlying     medical problems including her UTI and acute renal failure.     Hopefully, that will keep her at her baseline without further     confusion. 2. Acute renal failure.  More than likely secondary to dehydration and     prerenal.  We will hydrate the patient cautiously.  Follow renal     function closely.  If she is improving on fluids alone, then that     is fine.  If not, we will have to get renal ultrasound, check for     possible post renal causes. 3. UTI.  The patient's urinalysis shows wbc's that is too numerous to     count.  Based on her previously known isolate, we will consider     starting her on IV Rocephin for now.  We will wait for urine     culture to see if we need to make further changes.  She is,     however, not febrile and not having any white count which makes it     difficult to be aggressive with antibiotic treatment. 4. Dehydration.  Again, we will hydrate the patient aggressively and     follow closely. 5. Diabetes, seems to be stable at this point.  We will continue     sliding scale insulin. 6. Paroxysmal atrial fibrillation.  She is currently in sinus rhythm     and we will continue to monitor her on tele as necessary. 7. GERD.  Continue with PPI. 8. Dyslipidemia.   I will check fasting lipid panel if it has not been     checked recently.  We will continue with her statin. 9. History of coronary artery disease.  The patient has no chest pain     and her cardiac enzymes so far seems negative.  EKG does not show     any significant change. 10.Chronic pain syndrome.  We will continue with pain management as     necessary. 11.Hypothyroidism.  Continue with her Synthroid.  Further treatment     depends on the patient's response  to these measures.     Lonia Blood, [name]     Verlin Grills  D:  03/31/2011  T:  03/31/2011  Job:  [id number]  Electronically Signed by Lonia Blood [name] on 04/11/2011 04:06:27 PM

## 2011-04-20 NOTE — Discharge Summary (Signed)
[name], [name]             ACCOUNT NO.:  [id number]  MEDICAL RECORD NO.:  [id number]           PATIENT TYPE:  O  LOCATION:  1302                         [facility]ACILITY:  [facility]  PHYSICIAN:  Baltazar Najjar, MD     DATE OF BIRTH:  21-Sep-1925  DATE OF ADMISSION:  03/30/2011 DATE OF DISCHARGE:                              DISCHARGE SUMMARY   FINAL DISCHARGE DIAGNOSES: 1. Extended-spectrum beta-lactamase Klebsiella pneumoniae urinary     tract infection. 2. Urinary retention. 3. Acute kidney injury, resolved. 4. Altered mental status probably secondary to urinary tract infection     and dementia, improved. 5. Hypokalemia, repleted. 6. Hypoglycemia, resolved. 7. Hypertension, uncontrolled. 8. Diabetes mellitus type 2 with hypoglycemic episodes.  SECONDARY DISCHARGE DIAGNOSES: 1. History of atrial fibrillation. 2. History of gastroesophageal reflux disease. 3. History of congestive heart failure. 4. Hyperlipidemia. 5. History of hypothyroidism. 6. History of humeral fracture and subcapital fracture repair in     February 2010. 7. History of hematuria on hospitalization back in March 14, 2011. 8. History of colitis. 9. History of coronary artery disease. 10.History of constipation.  CONSULTATION:  During this hospitalization: 1. Telephone consult with Dr. Maurice March from Infectious Disease service. 2. Telephone consult with Dr. Annabell Howells from Urology service.  PROCEDURES:  Radiology/Imaging, renal sonogram showed no significant abnormalities of the right kidney.  Left kidney was unable to be visualized.  Bladder within normal range.  Normal appearance of bladder.  BRIEF ADMITTING HISTORY:  Please refer to H and P for more details.  In summary, [name] is an 77 year old woman, resident of [facility], brought into the ER with chief complaint of altered mental status.  Please refer to H and P for more details.  HOSPITAL COURSE: 1. Her urinalysis  showed too numerous to count WBCs.  The patient was     admitted with a diagnosis of urinary tract infection.  She was     initially started on Rocephin and urine culture was sent.  Her     initial labs also showed evidence of acute kidney injury.  The     patient was hydrated with resolution of her kidney injury which was     probably secondary to dehydration.  Urine culture grew ESBL     Klebsiella pneumoniae UTI.  Her case was discussed with Dr. Maurice March     from Infectious Disease service who recommended to start her     fosfomycin 3 g p.o. x2 doses.  The patient was already given 1 dose     in the hospital and she will be given a prescription for next.  Her     next dose to start on April 04, 2011.  Dr. Maurice March was kind enough to     call the microbiology lab and asked them draw sensitivity again for     fosfomycin that is still pending at the time of discharge and I     will defer followup of that sensitivity result to her PCP. 2. Altered mental status probably was secondary to combination of UTI     and dementia.  Her mental status  has improved and she is currently     at her baseline mental status. 3. Diabetes.  The patient is on glipizide.  She was found to have     hypoglycemic episodes and dose of glipizide was decreased to 2.5 mg     p.o. daily. 4. History of hyperlipidemia.  Her dose of Zocor was decreased to 10     mg because of interaction with calcium channel blockers and to     decrease the risk of rhabdomyolysis with those combination. 5. Urinary retention.  Foley catheter was inserted.  The patient will     be discharged with a Foley in place that discussed with Dr. Annabell Howells     from Urology.  Also looking back in her record looks like she had     an episode of hematuria in March 2012 and given both hematuria and     urinary retention, I requested a followup with Urology and Dr.     Annabell Howells asked for her nursing home to call and make an appointment to     be seen as an outpatient,  and for now he recommended to discharge     her with a Foley catheter in place.  DISCHARGE MEDICATION: 1. Fosfomycin 3 g p.o. x 1 dose to be given April 04, 2011.  This is     the second dose.  She was already given 1 dose in the hospital. 2. Glipizide 2.5 mg 1 tablet p.o. daily. 3. Labetalol 200 mg 1 tablet p.o. 3 times a day which is increased     from her dose of b.i.d. 4. Simvastatin 10 mg 1 tablet p.o. daily at bedtime, decreased from 20     mg as above. 5. Amiodarone 200 mg 1-1/2 tablets p.o. daily. 6. Artificial Tears 1 drop in both eyes q.2h. p.r.n. 7. Aspirin 81 mg p.o. daily. 8. Calcium 600/vitamin D 200 mg 1 tablet p.o. twice daily. 9. Dilaudid 4 mg 1 tablet p.o. q.6h. p.r.n. 10.Diltiazem CD/XT 360 mg 1 capsule p.o. daily. 11.Imdur 30 mg 1 tablet p.o. daily. 12.Lasix 40 mg 1 tablet p.o. daily. 13.Synthroid 125 mcg p.o. daily. 14.Nexium 40 mg p.o. daily. 15.Plavix 75 mg p.o. daily. 16.Senokot S 1 tablet p.o. twice daily. 17.VESIcare 1 tablet p.o. daily.  DISCHARGE INSTRUCTIONS: 1. The patient to continue above medications as prescribed. 2. PCP to follow culture and sensitivity to fosfomycin which is     submitted by Dr. Maurice March and still pending at the time of discharge. 3. The patient to follow with [facility].  She will be     provided with the number to call and make appointment as an     outpatient. 4. The patient will be discharged with a Foley catheter in place as     above in hospital course and as recommended by Urology group.  CONDITION ON DISCHARGE:  Improved.          ______________________________ Baltazar Najjar, MD     SA/MEDQ  D:  04/03/2011  T:  04/03/2011  Job:  [id number]  cc:   Lenon Curt. Chilton Si, [name] Fax: [phone]  House Physician [facility]  Electronically Signed by Hannah Beat MD on 04/20/2011 10:11:22 PM

## 2011-05-09 NOTE — Discharge Summary (Signed)
NAMEVELENA, [name]             ACCOUNT NO.:  [id number]   MEDICAL RECORD NO.:  [id number]          PATIENT TYPE:  INP   LOCATION:  3731                         FACILITY:  [facility]   PHYSICIAN:  Thereasa Solo. Little, [name] DATE OF BIRTH:  1925/12/03   DATE OF ADMISSION:  03/21/2009  DATE OF DISCHARGE:  03/24/2009                               DISCHARGE SUMMARY   DISCHARGE DIAGNOSES:  1. Unstable angina, ruled out for an myocardial infarction, and last      scan Myoview done this admission was negative for any ischemia.  2. Acute-on-chronic renal insufficiency with history of underlying      chronic kidney disease.  Recently, admitted for diastolic      dysfunction and discharged on March 18, 2009.  Her admission BMET      show that she had elevated creatinine, probably diuresed her Lasix      and other diuretics have been held.  3. Ejection fraction of 70% by quantitative gated SPECT.  4. Non-insulin-dependent diabetes mellitus.  5. History of permanent pacemaker, secondary to complete heart block      with a history of syncope.  6. Hypertension, controlled.  7. History of gastroesophageal reflux disease.  8. Hypothyroidism.  9. Osteoarthritis.  10.History of medication and lifestyle noncompliance.   LABORATORIES:  Admission CMET shows a BUN of 95 and creatinine of 3.11.  On March 24, 2009, her BUN was 49 and creatinine was 1.68.  CK-MB and  troponins were negative x3.  Hemoglobin A1c 6.6.  TSH was 1.729.  Her  hemoglobin 13.4, hematocrit 39.3, WBCs 5.1, and platelets 135.  [name], March 23, 2009, shows no evidence of ischemia or infarction.  EF of 70% on March 21, 2009.  Chest x-ray shows no acute findings.   DISCHARGE MEDICATIONS:  1. Synthroid 125 mcg a day.  2. Glipizide 5 mg a day.  3. Simvastatin 20 mg a day.  4. Diltiazem 360 mg a day.  5. Meloxicam 7.5 mg a day.  6. Detrol LA 4 mg a day.  7. Ranitidine 150 mg twice a day.  8. Atenolol was decreased to 50 mg a  day.  9. Plavix 75 mg a day.   OTHER DISCHARGE INSTRUCTIONS:  She will see Dr. Julieanne Manson, on April 02, 2009, at 10:15.  She will have her labs drawn next week on March 31, 2009, for a BMET, this will be faxed to Dr. Clarene Duke.   HOSPITAL COURSE:  Ms. [name] came into the hospital on March 21, 2009,  after being discharged on March 18, 2009.  Apparently, her aide  encouraged her to come.  She was having chest pain.  The pain was with  exertion, it was relieved by rest.  Her admission labs showed that she  had worsened renal failure.  She had underlying chronic kidney disease  and her creatinine the day before she was sent home on March 18, 2009,  was elevated at 2.55.  When she came in, it was 3.11.  This was thought  to be from over diuresis since her last hospitalization, was due to  diastolic dysfunction and CHF.  Her diuretics were held.  Her creatinine  improved over the next several days.  She did have a nuclear study,  which showed no ischemia.  Her chest pain may have been GERD related.  She was seen by Dr. Clarene Duke on March 24, 2009.  Her blood pressure is  107/66-116/70.  Her home heart rhythm was 60 and paced.  It was noted  that she had not received any atenolol for to days, thus at the time of  discharge, I decreased down to 50 mg a day, which she can start when she  arrives at home today or in the a.m.  She will [facility] to have [facility] to her house as previously done.      [name], N.P.    ______________________________  Thereasa Solo. Little, [name]    BB/MEDQ  D:  03/24/2009  T:  03/25/2009  Job:  [id number]   cc:   [name], [name]

## 2011-05-09 NOTE — H&P (Signed)
[name], [name]             ACCOUNT NO.:  [id number]   MEDICAL RECORD NO.:  [id number]          PATIENT TYPE:  INP   LOCATION:  3731                         FACILITY:  [facility]   PHYSICIAN:  [name] Mends, MD      DATE OF BIRTH:  12-06-1925   DATE OF ADMISSION:  03/21/2009  DATE OF DISCHARGE:                              HISTORY & PHYSICAL   CHIEF COMPLAINT:  Chest pain.   The patient recently discharged on March 18, 2009 after being admitted  for shortness of breath.  During the hospitalization, her BNP was not  elevated.  She underwent a 2-D echo with normal LV function, mild MR.  PFTs which revealed moderate-to-severe restriction, but she cannot  follow all the directions on the test.  Her Lasix was changed to 80 mg  once a day and she was stable for discharge.  She has been noncompliant  in the past.  It was recommended that she eat less than 2000 mg of  sodium daily and drink less than 1200 mL of fluid daily.  When she left  the hospital, her BUN was 55 and creatinine 2.24.   Today, she presents to the emergency room at [facility] because her aide  pushed her in to coming to the hospital secondary to chest pain.  With  exertion, the pain is relieved with rest.  No chest pain at present.  Her glucose was also elevated 364.  She presented to the hospital by  EMS.  Chest x-ray was done, no acute findings.  No pleural effusion or  pulmonary interstitial edema.  There is a left chest wall pacer device,  which with leads in the right atrial appendage and right ventricle, but  no acute findings.  She was seen, examined, and felt she should remain  overnight.  She will be scheduled for LEXA scan Myoview.   PAST MEDICAL HISTORY:  Chronic congestive heart failure, diastolic  medical noncompliance, and non-insulin-dependent diabetes mellitus.  Her  Glucophage was recently discontinued due to renal insufficiency.   The patient has a history of syncope and complete heart block with a  permanent transvenous pacemaker, hypertension, moderate MR by echo in  2003, by echo previous admission here on March 16, 2009, mild mitral  regurgitation, history of reflux disease, hypothyroidism, and  osteoarthritis.  EKG without acute changes.   FAMILY HISTORY:  See previous discharge summary.   SOCIAL HISTORY:  See previous discharge summary.   REVIEW OF SYSTEMS:  See previous discharge summary.   CURRENT OUTPATIENT MEDICATIONS:  1. Simvastatin 20 mg at bedtime.  2. Spironolactone 12.5 mg daily.  3. Potassium 20 mEq daily.  4. Furosemide 80 mg daily.  5. Diltiazem 360 daily.  6. Plavix 75 mg daily.  7. Meloxicam 7.5 mg daily.  8. Detrol LA 4 mg daily.  9. Levothyroxine 125 mcg daily.  10.Ranitidine 150 mg twice a day.  11.Atenolol 75 mg daily.   PHYSICAL EXAMINATION:  VITAL SIGNS:  On admission, blood pressure 110/60-  91/68, respiratory rate 16, pulse 82, and temperature 97.4.  GENERAL:  Alert, oriented female in no acute distress, pleasant  affect.  HEENT:  Sclera clear.  Pupils equal, round, reactive to light.  EOM  intact.  NECK:  Right JVD.  CHEST:  Clear to auscultation bilaterally.  HEART:  Regular rate and rhythm.  ABDOMEN:  Soft and nontender.  Positive bowel sounds.  EXTREMITIES:  Without edema.  SKIN:  No signs of infection or rash.  NEURO:  Alert and oriented.   LABORATORY DATA:  Hemoglobin 12.8, hematocrit 38.2, platelets 152, and  WBC 4.6.  Sodium 130, potassium 4, currently BUN 94, creatinine 3.08,  these have increased glucose 297, myoglobin 213, CK-MB less than 1.0,  troponin less than 0.05.  BNP was 572.   IMPRESSION:  1. Acute coronary syndrome.  2. Chronic diastolic heart failure.  Currently dehydrated.  3. Permanent transvenous pacemaker.   PLAN:  We will need to get further outpatient records.  In the meantime  IV heparin and serial cardiac enzymes.  Admit to telemetry bed.  We will  get a hold because of her increasing renal insufficiency.   We will hold  her spirolactone, potassium, furosemide, and meloxicam.  Continue her  other medications with parameters to hold her Cardizem for systolic  blood pressure less than 110.      [name], N.P.      [name] Mends, MD  Electronically Signed    LRI/MEDQ  D:  03/21/2009  T:  03/22/2009  Job:  [id number]

## 2011-05-09 NOTE — H&P (Signed)
[name], [name]             ACCOUNT NO.:  [id number]   MEDICAL RECORD NO.:  [id number]          PATIENT TYPE:  INP   LOCATION:  2028                         FACILITY:  [facility]   PHYSICIAN:  Thereasa Solo. Little, [name] DATE OF BIRTH:  04-13-1925   DATE OF ADMISSION:  03/16/2009  DATE OF DISCHARGE:                              HISTORY & PHYSICAL   ADMITTING DIAGNOSIS:  Diastolic congestive heart failure.   This 77 year old female has had a problem with noncompliance.  I had  seen her on March 09, 2009 and she had marked fluid overload.  She was  treated with Lasix 80 mg b.i.d. and came in for reevaluation today.   Her weight is down 3 pounds with aggressive diuresis and her caregiver  who accompanied her was adamant that she had taken her medications  regularly.  Despite this, she still had shortness of breath and moderate  edema, although her breathlessness has improved.  I had a discussion  with the patient about short-term hospitalization for aggressive IV  diuretics.  [name] was having some difficulty with lightheadedness  and weakness as a result of the diuretics and a decision was made to  admit her for IV diuretics.   From a cardiac standpoint, she had a cath in January 2008 that showed a  65% ejection fraction and she had a non-DES stent placed to her distal  right coronary artery.   In 2006, she had a syncopal episode, was found to be in complete heart  block, and had a permanent pacemaker.  She has also had some episodes of  PAF, but she is not considered to be a Coumadin candidate because of her  noncompliance.   Other medical problems include type 2 diabetes, hypertension, moderate  MR by echo in 2003, reflux, hypothyroidism, permanent pacemaker, and  coronary artery disease.   SOCIAL HISTORY:  She is widowed, has a caregiver who comes in and helps  her.  She does not use alcohol or tobacco products.  She says she is on  a reduced salt diet but eats a lot of can  and pre-processed foods.   FAMILY HISTORY:  Positive of coronary artery disease and diabetes  mellitus.   ALLERGIES:  1. LIPITOR.  2. PENICILLIN.   CURRENT MEDICATIONS:  1. Glyburide/metformin 5/500 b.i.d.  2. Zocor 20.  3. Aldactone 25 a half a tablet a day.  4. Potassium 10 mEq a day.  5. Lasix 80 mg b.i.d.  6. Plavix 75.  7. Detrol LA 4.  8. Synthroid 0.125.  9. Atenolol 50 mg one-half tablet a day.  10.Cardizem 360.  11.Mobic 15.  12.Zantac 150 mg.  13.Allegra 180.   REVIEW OF SYSTEMS:  She has seen no blood in her bowel movements and has  had no nausea or vomiting.  She has had no leg cramps.  She has had no  episodes of hypoglycemia.  Her appetite is good.  She has had no chest  pain and no palpitations.  However, if she lifts something heavy, she  has a fullness in her chest that lasts about 10 minutes and goes  away.  It has not been a recurrent problem for her and is relatively new.  This  is different from what she had when her stent was placed in 2008.   PHYSICAL EXAMINATION:  VITAL SIGNS:  Blood pressure sitting is 130/80,  standing 140/90, her weight is 195 pounds, and pulse is 72 and regular.  SKIN:  Warm and dry.  GENERAL:  She answers questions appropriately, but she is relatively  slow in responding.  LUNGS:  She had rales about half way up bilaterally.  No wheezes were  heard.  CARDIAC:  A 1/6 systolic murmur and an S3 gallop.  I did not appreciate  a diastolic murmur.  The murmur was heard best at the lower left sternal  border but tracked towards the apex.  ABDOMEN:  Soft.  I could not palpate her liver edge.  There was no  pulsation in the right upper quadrant.  She had no sacral edema.  No CVA  tenderness.  She had +2 tense edema bilaterally.  HEENT:  She is missing several teeth.  NECK:  Veins were 2 cm above the clavicle in the sitting position.   ASSESSMENT:  1. Fluid overload with diastolic heart failure.  Her ejection fraction      is 65%.   2. Coronary artery disease with a non-drug-eluting stent to her right      coronary artery in January 2008.  She is still on Plavix.  3. Diabetes mellitus.  4. Hypertension.  5. Hypothyroidism, on placement.  6. Permanent pacemaker for second-degree atrioventricular block and      history of paroxysmal atrial fibrillation.  She is not a candidate      for Coumadin because of noncompliance.   I plan to hold her metformin while she is in failure.  I changed her  Lasix to 80 mg IV b.i.d. and ordered Zaroxolyn 5 mg with a single dose  of Lasix.  In addition to that, she will need a 2-D echocardiogram to  assess her additional mitral regurgitation.   Hopefully, this will be a relatively short admission if she should  respond fairly promptly to aggressive diuretic therapy.            ______________________________  Thereasa Solo. Little, [name]    ABL/MEDQ  D:  03/16/2009  T:  03/17/2009  Job:  [id number]   cc:   [name], [name]

## 2011-05-09 NOTE — Discharge Summary (Signed)
NAMESHATYRA, BECKA             ACCOUNT NO.:  [id number]   MEDICAL RECORD NO.:  [id number]          PATIENT TYPE:  INP   LOCATION:  2028                         FACILITY:  [facility]   PHYSICIAN:  Thereasa Solo. Little, [name] DATE OF BIRTH:  30-Jan-1925   DATE OF ADMISSION:  03/16/2009  DATE OF DISCHARGE:  03/18/2009                               DISCHARGE SUMMARY   DISCHARGE DIAGNOSES:  1. Chronic congestive heart failure, diastolic.  2. Medication noncompliance.  3. Non-insulin-dependent diabetes mellitus.  4. History of syncope.  5. Complete heart block with a permanent pacemaker.  6. Hypertension.  7. Moderate mitral regurgitation by echo in 2003, echo done this      admission shows mild mitral regurgitation.  8. History of gastroesophageal reflux disease.  9. Hypothyroidism.  10.Osteoarthritis.   LABORATORIES:  Sodium 135, potassium 4.2, chloride 93, CO2 of 33, BUN  55, and creatinine 2.24.  On admission, her BUN was 28 and creatinine  was 1.45.  Hemoglobin A1c is 6.5.  TSH is 1.556.  Admission BNP was only  62.  Hemoglobin 12.8, hematocrit 38.8, WBCs 4.8, and platelets 163.  Chest x-ray showed stable, no acute disease.  Echocardiogram on March 17, 2009, shows an EF of 60%, diastolic relaxation abnormality, and mild  MR.   DISCHARGE MEDICATIONS:  1. Simvastatin 20 mg at bedtime.  2. Spironolactone 12.5 mg every day.  3. Potassium 10 mEq every day.  4. Furosemide 80 mg daily.  5. Diltiazem 360 mg every day.  6. Plavix 75 mg a day.  7. Meloxicam 7.5 mg daily.  8. Detrol LA 4 mg every day.  9. Levothyroxine 125 mcg per day.  10.Ranitidine 150 mg twice a day.  11.Atenolol 75 mg once a day.   She should stop her glyburide and metformin and she should start  glipizide 5 mg a day.  She was told to be compliant with less than 2000  mg of sodium per day and less than 1200 mL of fluid per day including  including anything that melted.  She should increase her activity slowly  and  she will have a walker and a bedside commode delivered to her house.   HOSPITAL COURSE:  Ms. Gruver is an 77 year old female patient of Dr.  Julieanne Manson, who was seen in the office.  She has had been in the past  prior history of noncompliance, has diastolic dysfunction, and has had  heart failure.  She went to the office on March 16, 2009.  She had a few  days earlier had been treated with increased Lasix, this apparently did  not help, thus she was admitted to the hospital.  She felt like she was  still short of breath.  Her weight was down 3 pounds from increasing her  diuretics at home from 80 mg once a day to 80 mg b.i.d.  In the  hospital, her BNP was not elevated.  It was decided that she should  undergo 2-D echocardiogram and this was performed.  She had normal LV  function, mild MR.  She also underwent PFTs, which preliminary LAD  showed  moderate-to-severe restriction, however, she could not follow all  the directions on the test.  On March 18, 2009, she was seen by Dr.  Clarene Duke, who changed her Lasix to 80 mg once a day.  He felt it was okay  to discharge her.  Blood pressure that morning was 110/78, heart rate  60, respirations 20, and temperature was 97.7.       [name], N.P.    ______________________________  Thereasa Solo. Little, [name]    BB/MEDQ  D:  03/18/2009  T:  03/19/2009  Job:  [id number]   cc:   [name], [name]

## 2011-05-12 ENCOUNTER — Emergency Department (HOSPITAL_COMMUNITY): Payer: Medicare Other

## 2011-05-12 ENCOUNTER — Inpatient Hospital Stay (HOSPITAL_COMMUNITY)
Admission: EM | Admit: 2011-05-12 | Discharge: 2011-05-16 | DRG: 698 | Disposition: A | Discharge status: Skilled Nursing Facility | Payer: Medicare Other | Attending: Internal Medicine | Admitting: Internal Medicine

## 2011-05-12 DIAGNOSIS — E785 Hyperlipidemia, unspecified: Secondary | ICD-10-CM | POA: Diagnosis present

## 2011-05-12 DIAGNOSIS — N179 Acute kidney failure, unspecified: Secondary | ICD-10-CM | POA: Diagnosis present

## 2011-05-12 DIAGNOSIS — Z79899 Other long term (current) drug therapy: Secondary | ICD-10-CM

## 2011-05-12 DIAGNOSIS — K219 Gastro-esophageal reflux disease without esophagitis: Secondary | ICD-10-CM | POA: Diagnosis present

## 2011-05-12 DIAGNOSIS — Z87440 Personal history of urinary (tract) infections: Secondary | ICD-10-CM

## 2011-05-12 DIAGNOSIS — Z950 Presence of cardiac pacemaker: Secondary | ICD-10-CM

## 2011-05-12 DIAGNOSIS — R339 Retention of urine, unspecified: Secondary | ICD-10-CM | POA: Diagnosis present

## 2011-05-12 DIAGNOSIS — Y846 Urinary catheterization as the cause of abnormal reaction of the patient, or of later complication, without mention of misadventure at the time of the procedure: Secondary | ICD-10-CM | POA: Diagnosis present

## 2011-05-12 DIAGNOSIS — N390 Urinary tract infection, site not specified: Secondary | ICD-10-CM | POA: Diagnosis present

## 2011-05-12 DIAGNOSIS — I252 Old myocardial infarction: Secondary | ICD-10-CM

## 2011-05-12 DIAGNOSIS — F068 Other specified mental disorders due to known physiological condition: Secondary | ICD-10-CM | POA: Diagnosis present

## 2011-05-12 DIAGNOSIS — E860 Dehydration: Secondary | ICD-10-CM | POA: Diagnosis present

## 2011-05-12 DIAGNOSIS — G92 Toxic encephalopathy: Secondary | ICD-10-CM | POA: Diagnosis present

## 2011-05-12 DIAGNOSIS — R079 Chest pain, unspecified: Secondary | ICD-10-CM | POA: Diagnosis present

## 2011-05-12 DIAGNOSIS — B961 Klebsiella pneumoniae [K. pneumoniae] as the cause of diseases classified elsewhere: Secondary | ICD-10-CM | POA: Diagnosis present

## 2011-05-12 DIAGNOSIS — Z880 Allergy status to penicillin: Secondary | ICD-10-CM

## 2011-05-12 DIAGNOSIS — I5032 Chronic diastolic (congestive) heart failure: Secondary | ICD-10-CM | POA: Diagnosis present

## 2011-05-12 DIAGNOSIS — I2510 Atherosclerotic heart disease of native coronary artery without angina pectoris: Secondary | ICD-10-CM | POA: Diagnosis present

## 2011-05-12 DIAGNOSIS — E876 Hypokalemia: Secondary | ICD-10-CM | POA: Diagnosis not present

## 2011-05-12 DIAGNOSIS — I509 Heart failure, unspecified: Secondary | ICD-10-CM | POA: Diagnosis present

## 2011-05-12 DIAGNOSIS — I4891 Unspecified atrial fibrillation: Secondary | ICD-10-CM | POA: Diagnosis present

## 2011-05-12 DIAGNOSIS — T83511A Infection and inflammatory reaction due to indwelling urethral catheter, initial encounter: Principal | ICD-10-CM | POA: Diagnosis present

## 2011-05-12 DIAGNOSIS — E119 Type 2 diabetes mellitus without complications: Secondary | ICD-10-CM | POA: Diagnosis present

## 2011-05-12 DIAGNOSIS — A498 Other bacterial infections of unspecified site: Secondary | ICD-10-CM | POA: Diagnosis present

## 2011-05-12 DIAGNOSIS — Z7982 Long term (current) use of aspirin: Secondary | ICD-10-CM

## 2011-05-12 DIAGNOSIS — G929 Unspecified toxic encephalopathy: Secondary | ICD-10-CM | POA: Diagnosis present

## 2011-05-12 LAB — CBC
HCT: 42.9 % (ref 36.0–46.0)
MCH: 28.7 pg (ref 26.0–34.0)
MCHC: 32.4 g/dL (ref 30.0–36.0)
RDW: 14.5 % (ref 11.5–15.5)

## 2011-05-12 LAB — DIFFERENTIAL
Basophils Absolute: 0 10*3/uL (ref 0.0–0.1)
Eosinophils Relative: 2 % (ref 0–5)
Lymphocytes Relative: 28 % (ref 12–46)
Monocytes Absolute: 0.4 10*3/uL (ref 0.1–1.0)

## 2011-05-12 LAB — BASIC METABOLIC PANEL
BUN: 24 mg/dL — ABNORMAL HIGH (ref 6–23)
Calcium: 10.6 mg/dL — ABNORMAL HIGH (ref 8.4–10.5)
Creatinine, Ser: 1.84 mg/dL — ABNORMAL HIGH (ref 0.4–1.2)
GFR calc non Af Amer: 26 mL/min — ABNORMAL LOW (ref >60)
Glucose, Bld: 138 mg/dL — ABNORMAL HIGH (ref 70–99)
Sodium: 138 mEq/L (ref 135–145)

## 2011-05-12 LAB — URINALYSIS, ROUTINE W REFLEX MICROSCOPIC
Glucose, UA: NEGATIVE mg/dL
Protein, ur: 30 mg/dL — AB
pH: 5 (ref 5.0–8.0)

## 2011-05-12 LAB — URINE MICROSCOPIC-ADD ON

## 2011-05-12 NOTE — Cardiovascular Report (Signed)
[name], DUIGNAN             ACCOUNT NO.:  [id number]   MEDICAL RECORD NO.:  [id number]          PATIENT TYPE:  INP   LOCATION:  2002                         FACILITY:  [facility]   PHYSICIAN:  Thereasa Solo. Little, [name] DATE OF BIRTH:  1925-11-17   DATE OF PROCEDURE:  DATE OF DISCHARGE:                            CARDIAC CATHETERIZATION   PRIMARY OPERATOR:  Dr. Caprice Kluver.   SECONDARY OPERATOR:  Dr. Yates Decamp.   INDICATION FOR TEST:  This 77 year old female has severe hypertension  __________  progressive angina.  She has a permanent pacemaker in, and  there were no changes on her EKG to show an infarct.  Her cardiac  markers were negative.   She was brought to the cath lab for cardiac catheterization.   PROCEDURE:  After obtaining informed consent, the patient was prepped  and draped in the usual sterile fashion, exposing the right groin.  Following local anesthetic with 1% Xylocaine, the Seldinger technique  was employed, and a 5-French introducer sheath was placed into the right  femoral artery.  Left and right coronary arteriography,  ventriculography, and aortic root injection was performed, and a complex  PCI was performed.   Dictation ended at this point.           ______________________________  Thereasa Solo. Little, [name]     ABL/MEDQ  D:  01/03/2007  T:  01/04/2007  Job:  [id number]   cc:   [name], [name]  Cath Lab

## 2011-05-12 NOTE — Discharge Summary (Signed)
NAMECLINTON, WAHLBERG                       ACCOUNT NO.:  [id number]   MEDICAL RECORD NO.:  [id number]                   PATIENT TYPE:  INP   LOCATION:  0354                                 FACILITY:  [facility]   PHYSICIAN:  Hettie Holstein, [name]                 DATE OF BIRTH:  November 15, 1925   DATE OF ADMISSION:  07/18/2003  DATE OF DISCHARGE:  07/22/2003                                 DISCHARGE SUMMARY   ADMISSION DIAGNOSES:  1. Renal failure.  2. Volume depletion.   DISCHARGE DIAGNOSES:  1. Acute renal failure.  2. Volume depletion.  3. Orthostasis.  4. Coronary artery disease with an myocardial infarction in 1970s and     history of cardiac catheterization September 2003, essentially normal     except left ventricular hypertrophy and a dilated aortic root.  5. Hypothyroidism.  6. Diabetes mellitus, non-insulin-requiring.  7. Status post hysterectomy secondary to fibroids.  8. History appendectomy.  9. History cholecystectomy.  10.      Colonoscopy one year ago.  11.      EGD one year ago.   CONSULTS:  [facility], Thereasa Solo. Little, [name]   PROCEDURE:  1. Carotid Doppler studies revealing mild disease.  2. Echocardiogram revealed ejection fraction of 60%.  3. CT of the chest and CT of the head revealed no acute findings.   DISCHARGE MEDICATIONS:  1. Clonidine 0.1 mg p.o. b.i.d.  2. Aspirin 81 mg p.o. daily.  3. Mavik 4 mg p.o. daily.  4. Metaglip 5/500 one p.o. daily.  5. Synthroid 0.112 mg p.o. daily.  6. Medications that she had typically been on which were to be resumed at     the discretion of her primary care physician include Lasix, Zaroxolyn,     Diovan, Glucotrol XL.   DIET:  She is instructed to maintain a low sodium, moderate consistent  carbohydrate diet.   FOLLOWUP:  Instructed to follow up with her PCP in one week and her  cardiologist in two weeks.   HISTORY OF PRESENT ILLNESS:  This is a 77 year old African-American female  with diabetes  mellitus, hypertension, obesity, and history of MI in 1970 who  has been having persistent nausea and poor p.o. intake over the past three  weeks.  She lives alone.  Over the past three days has felt weak nonfocally.  She denies any chest pain.  Has chronic shortness of breath.  She saw PCP  last week but no laboratory results are available at time of admission.  She  is also complaining of diffuse abdominal cramping, but denies decreased  urinary output.  She has had diarrhea for a day.  In the emergency  department CT was performed with oral contrast, but revealed no acute  finding.  There was mention about pericardial effusion.   HOSPITAL COURSE:  Ms. Hoston was admitted to a regular nursing floor.  She  was started on  IV fluids and then continued medications that she was on at  home with exception of diuretics and ACE inhibitors.  Her I&O were followed  closely.  She was placed on insulin sliding scale.  A CT of her chest was  performed secondary to an abnormal chest x-ray.  Chest x-ray on admission  had mentioned opacity in the medial right upper lobe, could be bony or  vascular in origin, but cannot exclude medial right upper lobe lesion  compared with prior chest x-ray and consider CT of the chest.  Follow-up CT  of her chest revealed no acute abnormalities.  Secondary to pericardial  effusion, cardiology was asked to see her.  2-D echocardiogram was  performed.  EF was found to be 65%.  No wall motion abnormalities.  No major  valve abnormalities.  There was a mention of only a trivial pericardial  effusion.  No further work-up was recommended by cardiology at that time.  She was evaluated by [facility].  Her hospital course was  unremarkable.  Presyncopal episode which she had experienced in the hospital  did not recur.  She underwent carotid Doppler studies which revealed only  mild disease.  She was discharged home in stable condition.   DISCHARGE LABORATORIES:  Sodium  140, potassium 4.2, BUN 10, creatinine 0.8,  glucose 147.  As noted above, the patient underwent CT scanning of her chest  as well as abdomen and pelvis and echocardiography this admission.  The  results are dictated in the body above.  Please address any questions  regarding this admission to [facility] hospitalists at [phone].                                               Hettie Holstein, [name]    ESS/MEDQ  D:  09/07/2003  T:  09/08/2003  Job:  [id number]

## 2011-05-12 NOTE — Discharge Summary (Signed)
[name], HORRELL             ACCOUNT NO.:  [id number]   MEDICAL RECORD NO.:  [id number]          PATIENT TYPE:  INP   LOCATION:  6527                         FACILITY:  [facility]   PHYSICIAN:  Thereasa Solo. Little, [name] DATE OF BIRTH:  23-Oct-1925   DATE OF ADMISSION:  01/02/2007  DATE OF DISCHARGE:  01/05/2007                               DISCHARGE SUMMARY   HISTORY OF PRESENT ILLNESS:  Ms. [name] is a 77 year old African-  American female patient who came to the ER because of chest pain, she  was brought by EMS.  She had intermittent left chest pain radiating down  to her left arm, states same pain as she has had before in the past, but  she has not had it recently.  She is also complaining of dyspnea on  exertion, no nausea, no vomiting and no diaphoresis.  Her pain lasts  about 15 to 20 minutes when she has it.  She apparently was just seen in  the office by Dr. Clarene Duke on Thursday at which time it was noted that she  had a new right bundle-branch block.  Her Lasix was increased secondary  to increased shortness of breath.  She has no known coronary disease.  She had a cath September 23, 2002, she had no CAD at that time.  She had  a Persantine Cardiolite April 17, 2003, her EF was 64% and she had no  ischemia.  She does have cardiac risk factors including hyperlipidemia,  hypertension, diabetes mellitus.  She has a PT/VDP Med[employer] EnRhythm  and a history of a complete heart block and PAF.  She is not on Coumadin  secondary to compliance issues.  It is also noted that she has some  valvular heart disease.  She had a moderate MR by an echo in 2003.  She  was admitted, she was ruled out for an MI, she was seen by Dr. Domingo Sep  and Dr. Clarene Duke and it was decided that she should undergo a cardiac  catheterization.  She was on IV heparin and IV nitroglycerin.  Her cath  revealed that she did have an RCA blockage of 80% distally.  She had a  difficult procedure, but she did have a 2.5 x 28  Vision stent placed by  Dr. Clarene Duke and also Dr. Jacinto Halim assisted.  Because she had received  approximately 310 mL of dye, it was decided to keep her a few days to  watch her renal function because she does have a history of some renal  insufficiency.  On January 05, 2007, her BUN was 16, creatinine 1.18,  she was considered stable for discharge home.  She did have a mild  increase in her troponin of 0.26, CK-MB was negative.  She was seen by  Dr. Tresa Endo and discharged home.  Her blood pressure was 129/57, heart  rate 62, respirations 17, temp 98.3 and O2 saturations 93%.   LABS:  Sodium 136, potassium 4.2, BUN 16, creatinine 1.18, CK-MB 88/1.7,  troponin 0.26.   OTHER LABS:  Hemoglobin 11.6, hematocrit 34.8, WBC 8.6 and platelets  169, hemoglobin A1c was 6.4, calcium  was 9.8, CK-MBs were all negative,  BNP was 51, AST was 17, ALT was less than 8, TSH was 1.214, urine showed  no pathology and glomerular filtration rate was 54.   DISCHARGE MEDICATIONS:  1. Glucovance 5/500 two times per day.  2. Atenolol 50 mg two times per day.  3. Benazepril 20 mg one time per day.  4. Diltiazem 240 mg one time per day.  5. Levothyroxine 112 mcg one time per day.  6. Claritin 10 mg one time per day.  7. Allegra as taken at home.  8. Lasix 40 mg one time per day.  9. K-Dur 20 mEq one time per day.  10.Spironolactone 12.5 mg one time per day.  11.Simvastatin 20 mg at bedtime.  12.Aspirin 81 mg once a day.  13.Plavix 75 mg once a day.  14.Protonix 40 mg once per day.   DISCHARGE DIAGNOSES:  1. Unstable angina.  2. Coronary artery disease status post cardiac catheterization      revealing RCA stenosis with subsequent stenting with a non-drug-      eluting stent placed by Dr. Clarene Duke and Dr. Jacinto Halim.  3. New right bundle-branch block.  4. Hypertension.  5. History of paroxysmal atrial fibrillation.  6. PT/VDP.  7. Non-insulin-dependent diabetes mellitus.  8. Hyperlipidemia.      [name],  N.P.    ______________________________  Thereasa Solo. Little, [name]    BB/MEDQ  D:  01/05/2007  T:  01/06/2007  Job:  [id number]   cc:   [name], [name]

## 2011-05-12 NOTE — Cardiovascular Report (Signed)
NAMETEKEISHA, HAKIM             ACCOUNT NO.:  [id number]   MEDICAL RECORD NO.:  [id number]          PATIENT TYPE:  INP   LOCATION:  6527                         FACILITY:  [facility]   PHYSICIAN:  Thereasa Solo. Little, [name] DATE OF BIRTH:  02/15/1925   DATE OF PROCEDURE:  01/03/2007  DATE OF DISCHARGE:                            CARDIAC CATHETERIZATION   INDICATIONS FOR TEST:  This 77 year old female has longstanding severe  hypertension which has been difficult to control because of multiple  medications and compliance related issues.   She has had a five-day history of progressive exertional anginal and was  admitted on January 02, 2007 with anginal quality pain.  Her cardiac  markers were negative.  She has a permanent pacemaker in place.  There  were no significant EKG changes appreciated.   PROCEDURE:  The patient was prepped and draped in the usual sterile  fashion exposing the right groin.  Following local anesthetic with 1%  Xylocaine, the Seldinger technique was employed and a 5-French  introducer sheath was placed into the right femoral artery.  Left and  right coronary arteriography, ventriculography and aortic arch injection  was performed.  Following this, a complex percutaneous intervention to  the distal RCA was performed.  IVUS evaluation of the circumflex system  was also performed.   IV medications included double bolus Integrilin, IV heparin for a total  of 5400 units, Versed 4 mg, fentanyl 125 mg.   COMPLICATIONS:  None.   DIAGNOSTIC EQUIPMENT:  5-French Judkins configuration catheters.   INTERVENTIONAL EQUIPMENT:  See below.   TOTAL CONTRAST USED:  310 mL.   HEMODYNAMIC MONITORING:  Central aortic pressure was 181/85.  Left  ventricular pressure was 187/6 with a left ventricular end-diastolic  pressure of 13.  On the ventriculogram, there appeared to be  obliteration of the midportion of the LV cavity.  Because of this, a  multipurpose wire was placed in the  apex of the left ventricle and  gradually pullback into the subvalvular area.  In doing this, there was  no evidence of any gradient within the cavity.   VENTRICULOGRAPHY:  1. Ventriculography in the RAO projection revealed normal LV function      with LVH and near obliteration of the midportion of the cavity.      Ejection fraction was 65%.  2. Aortic root:  The aortic root was markedly dilated.  There was no      evidence of a dissection in the ascending root.   CORONARY ARTERIOGRAPHY:  1. Left main:  This was large and normal and about 5 mm.  2. LAD:  The LAD extended down and across the apex of the heart.  It      gave rise to one large diagonal branch.  The midportion of the LAD      was at least 4 mm in diameter.  3. Circumflex:  The circumflex basically was one large OM vessel.  It      was tortuous and where the vessel came out of the AV groove there      appeared to be a hazy area.  Multiple views, even upgrading to a 6-      [place] catheter, never fully visualize this to the point I could be      sure it was streaming.  Because of this, intervascular ultrasound      was performed.  There was no evidence of any dissection.  There was      minor plaquing but no thrombus.  There was brisk TIMI III flow      during the entire procedure through this vessel.  4. Right coronary artery:  The right coronary artery had calcification      at its ostium and in the aorta above and below the takeoff.  The      right coronary artery came up and had a large Shepherd crook      tortuosity in its midportion and then distal was a long tubular      lesion of around 70-80% narrowing.  It stopped just before the      bifurcation of the PDA and posterior lateral vessel.  There was      brisk TIMI III flow down through this area.   Because of the high-grade lesion in the RCA, arrangements were made for  intervention.  The patient was given double bolus Integrilin, and after  appropriate level of  anticoagulant was obtained and documented by ACT,  attempted intervention were undertaken.  A variety of guide catheters  were used to try to cannulate this calcified takeoff.  A JL-4, a JL-5, a  JL-6, an  AL-2, a right XB catheter and finally a Williams right was used.  In  addition to that a Noto catheter was used.   Of all the above-mentioned catheters, the only one that would cannulate  the ostium was a the [place] right.  Once the ostium was cannulated, a  guidewire was placed down the distal RCA well past the area of  obstruction.  Because of compliance problems in the past, a decision was  made to use a nondrug-eluting stent.  A Mini-Vision 2.5 x 28 stent made  ready.  It was difficult to cross the initial Shepherd crook in the  proximal portion of the RCA with a stent.  Once a stent was down to the  distal portion of the vessel, I was able to reengage the ostium with the  guide catheter.  After obtaining appropriate angiographic appearance of  the stent so that both the proximal and distal portion of this area was  well covered, the stent was initially deployed with 16 atmospheres of  pressure for 51 seconds and the final inflation was 18 atmospheres for  45 seconds.   Postdilatation with a 2.75 x 15 Maverick balloon was then used.  Then 18  atmospheres for 45, 30, 30 and again 30 seconds were performed.  This  was all done within the body of the stent itself.   There was no evidence any dissection or thrombus formation and there was  no evidence of distal embolization.  The area that had been an 80%  narrowed pre intervention now appeared to be normal.  There was brisk  TIMI III flow pre and post.  The PDA and posterolateral vessels remained  widely patent.  There was no evidence of any injury to the ostium of the  vessel.   The patient was given 600 mg total of p.o. Plavix.  She will be aggressively hydrated because of the large volume of contrast media  used.  BNP will  be checked in the morning and she will probably be kept  48 hours total to make sure she has complete clearance of her contrast  media.           ______________________________  Thereasa Solo. Little, [name]     ABL/MEDQ  D:  01/03/2007  T:  01/03/2007  Job:  [id number]   cc:   [name], [name]  Cath Lab

## 2011-05-12 NOTE — Op Note (Signed)
NAMELUCINE, [name]             ACCOUNT NO.:  [id number]   MEDICAL RECORD NO.:  [id number]          PATIENT TYPE:  INP   LOCATION:  4733                         FACILITY:  [facility]   PHYSICIAN:  Darlin Priestly, MD  DATE OF BIRTH:  October 18, 1925   DATE OF PROCEDURE:  08/14/2005  DATE OF DISCHARGE:                                 OPERATIVE REPORT   PROCEDURE:  Insertion of a Medtronic Inrhythm P1501DR generator, serial  number [id number] with passive atrial and ventricular leads.   SURGEON:  Darlin Priestly, [name]   COMPLICATIONS:  None.   INDICATIONS FOR PROCEDURE:  [name] is a 77 year old female patient of  [name], [name], [name], [name] with a history of noncritical  CAD by catheterization in 2003, history of moderate mitral regurgitation,  hypertension, non-insulin dependent diabetes mellitus, who was recently  admitted with bradycardia with a 2:1 AV block.  She also has a history of  paroxysmal atrial fibrillation.  Because of her atrial fibrillation, she is  now referred for pacer implant.   DESCRIPTION OF PROCEDURE:  After informed written consent, the patient was  brought to the cardiac catheterization lab where left chest was prepped and  draped in the usual sterile fashion.  ECG monitor established.  1% lidocaine  was then used to anesthetize the left subclavian region.  After adequate  anesthesia, approximately 3 cm horizontal mid infraclavicular incision was  then carried out and hemostasis was obtained with electrocautery.  Blunt  dissection was then used to carry this down to the left pectoral fascia.  Approximately 3 x 4 cm pocket was then created over the left pectoral fascia  and hemostasis was obtained with electrocautery.  Next, under fluoroscopic  guidance, the left subclavian vein was then easily entered and a guide wire  was then easily passed into the SVC and right atrium.  Next a 9 French  dilator and sheath was then easily tracked over  the retained guide wire and  the guide wire and peel-away sheath were removed.  Following this, a 52 cm  passive Medtronic ventricular lead model number [id number], serial number  [id number] was then easily tracked into the right atrium.  Guide wire was  retained and peel-away sheath was removed.  A second 9 French dilator and  sheath were then tracked over the retained guide wire and the guide wire and  dilator removed.  A second 45 cm passive Medtronic lead model number 4592,  serial number [id number] was then easily passed into the right atrium.  Guide wire was retained and peel-away sheath was removed. The retained guide  wire was then fastened to the sheet with a Hemostat.  A J curve was then  placed on the ventricular lead stylet and the ventricular lead was then  allowed to prolapse through the tricuspid valve and position in the RV apex.  Threshold was then determined.  All R waves were measured at 7.6 millivolts,  impedance was 683 ohms, threshold  was 0.4 volts at 0.5 milliseconds.  Current was 0.9 millivolts, 10-volt was negative.  The atrial lead was then  positioned in the right atrial appendage and thresholds were determined.  P  waves were measured at 4.2 millivolts, impedance was 481 ohms, threshold was  0.7 volts at 0.5 milliseconds, current threshold 0.9 millivolts.  10-volt  again was negative for diaphragmatic stimulation.  Two silk sutures were  then placed around each lead to anchor the leads to the pectoral fascia.  The pocket was then copiously irrigated with 1% Kanamycin solution and the  retained guide wire was then removed.  The leads were then connected in a  serial fashion to a Medtronic Inrhythm P1501DR serial number [id number]  generator.  Hex screws were tightened and pacing was confirmed.  A single 2-  0 silk suture was placed in the apex pocket.  The generator and leads were  delivered into the pocket and the header was then secured to the silk  suture.  The  subcutaneous layer was then closed using a running 2-0 Vicryl.  The skin layer was then closed using a running 4-0 Vicryl.  Steri-Strips  were then applied.  The patient was then transferred to the recovery room in  stable condition.   CONCLUSION:  Successful implant of a Medtronic Inrhythm [id number] generator,  serial number [id number] with passive atrial and ventricular leads.      Darlin Priestly, MD  Electronically Signed     RHM/MEDQ  D:  08/14/2005  T:  08/14/2005  Job:  [id number]   cc:   [name], [name]  [address]  [address]  [place]  [place] [address]  Fax: [phone]   [name], [name]  [address]  [place]  [place] [address]  Fax: [phone]

## 2011-05-12 NOTE — H&P (Signed)
NAMECANDENCE, SEASE                       ACCOUNT NO.:  [id number]   MEDICAL RECORD NO.:  [id number]                   PATIENT TYPE:  INP   LOCATION:  3736                                 FACILITY:  [facility]   PHYSICIAN:  Thereasa Solo. Little, [name]              DATE OF BIRTH:  1925/06/22   DATE OF ADMISSION:  07/24/2002  DATE OF DISCHARGE:  07/27/2002                                HISTORY & PHYSICAL   HISTORY OF PRESENT ILLNESS:  The patient is a 77 year old female.  She has a  history of hypertension and peripheral edema.   She was seen earlier today in Dr. Hardie Pulley office and sent to my office  for evaluation.  Over the last 2-3 days she has had left anterior chest  tightness that radiates underneath her left breast into the axilla, lasts  about 20 minutes, is associated with shortness of breath and diaphoresis.  She had an episode earlier this morning in Dr. Hardie Pulley office.  She is  pain free now.  About 3 days ago with a similar event she had a syncopal  episode.   She has had increasing swelling of her lower extremities and increasing  shortness of breath gradually over the last 2 months.   PAST CARDIAC HISTORY:  Cardiac catheterization in 1996 that showed no  coronary disease, left ventricular hypertrophy with a normal ejection  fraction, a nuclear study in 01/2000, negative for ischemia, 60% ejection  fraction.   PAST MEDICAL HISTORY:  Diabetes mellitus type II, hypertension, chronic mild  peripheral edema.   OUTPATIENT MEDICATIONS:  Toprol 50 mg b.i.d., K-Dur 20 mEq q.d., Lipitor 10  mg q.d., Levoxyl 0.112 mg q.d., Glucotrol 2.5 mg q.d., Clarinex one q.d.,  Demadex 40 mg q.d., Diovan 320 mg and Oxytrol patch.   The patient had recently been on Norvasc 10 mg, but this was discontinued  about one week ago because of the worsening peripheral swelling.  The  swelling has not substantially changed.   SURGICAL PROCEDURE:  Hysterectomy in the 1970s and a breast cyst  removed in  the 1980s.   FAMILY HISTORY:  Father had heart disease at age 67, mother and father are  both hypertensive, father had a stroke at age 42, mother and father are  diabetics.   SOCIAL HISTORY:  The patient is widowed, has an adult child, does domestic  type work.  No cigarettes, no alcohol.   REVIEW OF SYSTEMS:  Diet is appropriate with reduced sodium, takes her  medications regularly.  Bowel habits are normal.  She has had no vomiting or  nausea.  She denies any awareness of any palpitations and she does have  occasional episodes of 2 pillow orthopnea.  Nocturia 2 times per evening.  No dysuria.  No blood in her bowel movements.  Occasional nasal discharge  and nonproductive cough.  Weight is stable, appetite is good.   PHYSICAL EXAMINATION:  GENERAL:  Elderly  female in no acute distress.  VITAL SIGNS:  Blood pressure right arm 204/120, left arm 198/112, heart rate  is 72 and regular.  Respirations are 16 and unlabored.  Weight 198.5 pounds.  Skin is warm and dry.  The patient is pain free.  Oxygen saturations on room  air are 97%.  NECK:  Supple.  Thyroid was nonpalpable.  Left louder than right carotid  bruit.  Posterior pharynx normal.  Marked nicking and narrowing of  funduscopic vessels.  LUNGS:  Diminished breath sounds at the bases, but no rales or wheezes.  CARDIAC:  I/VI systolic murmur lower left sternal border.  No real radiation  pattern.  No palpable chest wall tenderness.  ABDOMEN:  Reveals normal bowel sounds and no bruits.  Right upper quadrant  tenderness.  Liver edge is palpable and slightly pulsatile.  EXTREMITIES:  2+ soft edema to the knees bilaterally with +1 pulses in the  upper and lower extremities, no clubbing, or cyanosis of nail beds.  NEUROLOGIC:  Intact.   ASSESSMENT:  1. Chest pain, rule out myocardial infarction.  2. Severe hypertension with questionable hypertensive crisis/chest pain     secondary to marked elevation of her blood  pressure.  3. Peripheral edema, questionable mild congestive heart failure, check BMP.  4. Adult onset diabetes mellitus.   PLAN:  I have sent the patient to the emergency room to start IV  Nitroglycerin and start on oral Clonidine 0.2 mg q.8h. in addition to her  outpatient medications.  After her lab is back we will start her on Lovenox  80 mg subcu q.12h.  I have ordered an echocardiogram and she will probable  need a cardiac catheterization on Monday depending on how her clinical  status is.                                                Thereasa Solo. Little, [name]    ABL/MEDQ  D:  07/24/2002  T:  07/29/2002  Job:  [id number]   cc:   [name], [name]

## 2011-05-12 NOTE — Discharge Summary (Signed)
NAMEZYNASIA, BURKLOW                       ACCOUNT NO.:  [id number]   MEDICAL RECORD NO.:  [id number]                   PATIENT TYPE:  INP   LOCATION:  3736                                 FACILITY:  [facility]   PHYSICIAN:  Therisa Doyne, N.P.               DATE OF BIRTH:  01-13-1925   DATE OF ADMISSION:  07/24/2002  DATE OF DISCHARGE:  07/27/2002                                 DISCHARGE SUMMARY   ADMISSION DIAGNOSES:  1. Chest pain, rule out myocardial infarction.  2. Hypertensive crisis.  3. Peripheral edema, questionable congestive heart failure.  4. Adult-onset diabetes mellitus.   DISCHARGE DIAGNOSES:  1. Hypertensive crisis resolved.  2. Adult-onset diabetes mellitus.  3. Bradycardia secondary to medications, beta blocker.   PROCEDURES:  None.   COMPLICATIONS:  None.   CONDITION ON DISCHARGE:  Stable, improved.   ADMISSION HISTORY:  This is a 77 year old female who was sent to our office  from her primary care physician's office, Dr. Marisue Brooklyn, with severe  hypertension, peripheral edema, and shortness of breath with some chest  tightness.  She relates a history over approximately the past two or three  days of experiencing left anterior chest tightness that would radiate under  her left breast and into the left axilla.  She states it would last  approximately 20 minutes. She would have associated shortness of breath as  well as diaphoresis.  Unsure as to whether or not she had a true syncopal  event with this approximately three days ago with her recounting feeling  very dizzy, lightheaded, and feeling faint.  She presented to Dr.  Hardie Pulley office for further evaluation and actually had an episode of  chest discomfort while she was there.  Found to be severely hypertensive and  was sent to our office for further treatment and evaluation.  She was  essentially sent from our office, after brief evaluation, to the hospital  for admission.   PHYSICAL  EXAMINATION:  VITAL SIGNS:  On admission, blood pressure 204/120 in  the right arm, 198/112 in the left arm.  Heart rate 72.  Respirations 16.  Temperature 97.8, O2 saturation 99% on room air.  GENERAL:  Conscious, alert, well oriented.  She appeared in no acute  distress.  SKIN:  Warm and dry, normal color and temperature.  NECK:  She had no thyromegaly or JVD.  She had bilateral carotid bruits with  the left being more prominent than the right.  HEENT:  Funduscopic exam revealed some nicking and narrowing of the  ophthalmic vessels.  LUNGS: Decreased breath sounds at the bases bilaterally.  Ther were no rales  or wheezes.  HEART:  Regular rate and rhythm, 1/6 systolic murmur noted at that the lower  left sternal border.  ABDOMEN:  Soft, nondistended.  Normal bowel sounds in all quadrants.  Lower  liver border was palpable and slightly pulsatile.  Seh did have some right  upper quadrant tenderness.  There was no abdominal bruit.  EXTREMITIES:  Noted +2+ soft edema up to the level of the knees bilaterally.  Pulses in all extremities were +1 bilaterally.  No clubbing or cyanosis.  NEUROLOGIC:  No deficits.   LABORATORY DATA:  Chest x-ray on admission showed cardiomegaly with some  mild chronic bronchitic changes.  There was also a noted tortuous aorta.   Admission labs show normal CBC and CMP.  Initial cardiac enzymes show a  total CK of 148 with 2.4 MB and a troponin of less than 0.01.  TSH was  normal at 1.567.  BNP was anomal at 59.4.   HOSPITAL COURSE:  The patient was admitted for blood pressure control as  well as rule-out MI protocol.  She was started on IV nitroglycerin and an  aspirin.  He beta blocker was continued.  Subcutaneous Lovenox was also  initiated as was p.o. Clonidine.  A 2-D echocardiogram was ordered to check  for LV function.   On day #2 of admission, she was still notable hypertensive.  She had no  complaints of shortness of breath or chest discomfort.   Repeat cardiac  enzymes were normal.  Her weight was down approximately 4 pounds after IV  Lasix.  Chest film was negative for MI with enzymes normal x 3.  IV  nitroglycerin was discontinued at this point.  Her IV Lasix was changed to  p.o.  The 2-D echocardiogram revealed normal LV function with moderate LVH.  She also had mild MR an TR.   Around 10 o'clock on day #2 of admission, she became somewhat bradycardic  with her heart rate dropping into the 40s.  Blood pressure as stable, and  she was asymptomatic with this.  Her beta blocker dose was held.   On day #3 of admission, she again had no complaints.  She had no awareness  of her bradycardia the previous night.  Labs remained stable.  Her dose of  Toprol was decreased, and Norvasc was increased for blood pressure control.  She was up and ambulating later that day without any difficulties.   On 07/27/2002, the patient was discharged home in stable condition.  Blood  pressure at discharge showed ranges systolic 120s to 981 with diastolic  reading in the 80s and 90s.  Heart rate remained stable in the upper 50s and  60s.  Her beta blocker was discontinued at this point, and she was simply  continued on Norvasc for hypertension.  Beta blocker was discontinued  secondary to bradycardia. She was discharged home without incident.   DISCHARGE MEDICATIONS:  1. Potassium 20 mEq q.d.  2. Lasix 40 mg q.d.  3. Lipitor 10 mg q.d.  4. Synthroid 0.112 mg q.d.  5. Glucotrol 2.5 mg q.d.  6. Diovan 320 mg q.d.  7. Norvasc 10 mg q.d.  8. Clonidine 0.3 mg b.i.d.  She is not to resume taking Toprol or Demadex.   ACTIVITY:  As tolerated.   DIET:  She is to maintain a low-salt diet.    FOLLOW UP:  We will arrange for her to have outpatient renal Dopplers for  further evaluation of hypertension.   She is to see Dr. Clarene Duke on Tuesday, 07/29/2002, for followup.  She is to call for her appointment time.  Therisa Doyne, N.P.    HEB/MEDQ  D:  08/05/2002  T:  08/08/2002  Job:  [id number]   cc:   [name], [name]

## 2011-05-12 NOTE — Discharge Summary (Signed)
NAMECOLLYN, [name]             ACCOUNT NO.:  [id number]   MEDICAL RECORD NO.:  [id number]          PATIENT TYPE:  INP   LOCATION:  4733                         FACILITY:  [facility]   PHYSICIAN:  Thereasa Solo. Little, [name] DATE OF BIRTH:  12/04/1925   DATE OF ADMISSION:  08/08/2005  DATE OF DISCHARGE:  08/18/2005                                 DISCHARGE SUMMARY   DISCHARGE DIAGNOSES:  1.  Syncope.  2.  Hypotension, resolved.  3.  Complete heart block, significant bradycardia, status post permanent      pacemaker insertion with Medtronic EnRhythm P1501DR, serial [id number],      DDD.  4.  Non-insulin-dependent diabetes mellitus, type 2.  5.  Hypertension.  6.  Gastroesophageal reflux disease.  7.  History of recent polypectomy by Dr. Elnoria Howard on August 03, 2005.  8.  Valvular heart disease with moderate mitral regurgitation by      echocardiogram of 2003.  9.  Ascending aorta appears mildly dilated on 2-D echocardiogram but could      not be confirmed with certainty.  10. Hypothyroidism.  11. Debilitation, resolved.   CONDITION ON DISCHARGE:  Improved.   PROCEDURE:  Permanent pacemaker placement of a Medtronic EnRhythm, serial  [id number], per Dr. Jenne Campus.   DISCHARGE MEDICATIONS:  1.  Synthroid 0.112 mcg daily.  2.  Enteric-coated aspirin 81 mg daily.  3.  Potassium chloride 10 mEq daily.  4.  Furosemide 40 mg daily.  5.  Zocor 20 mg every evening.  6.  Metaglip 5/500 one twice a day.  7.  Clarinex 5 mg daily.  8.  Lotensin 20 mg daily.  This is a new medication.  9.  Imdur 30 mg daily.  10. Atenolol 25 mg twice a day.  11. Spironolactone 12.5 mg daily.  This is a new medication.  12. Cardizem CD 240 mg daily.  This is a new medication.  13. Protonix 40 mg daily.  14. Stop Lotrel.   DISCHARGE INSTRUCTIONS:  1.  Low fat, low salt, diabetic diet.  2.  No showers until Monday and then shower and pat dry.  3.  Call for any swelling.  4.  Instructed on arm movement.  5.   Follow up with Nada Boozer, nurse practitioner, and with Dr. Clarene Duke on      August 24, 2005 at 2 p.m.  6.  Home health care should draw blood on Monday, basic metabolic panel.   HISTORY OF PRESENT ILLNESS:  The patient is a 77 year old African American  female who came to the emergency room hypotensive, with possible complete  heart block, bradycardia, 2:1 block, and PAF.  Apparently, she did not feel  well for two weeks, fatigued, faint-feeling, and went to her senior citizen  and progressive felt worse.  She eventually passed out at bingo.  EMS was  called and reported complete heart block, pulseless, and loss of  consciousness.  No rhythm strips were available.  Here in the ER, she was  given atropine and paced with external pacer.  Blood pressure was 68, heart  rate came up to 57 and blood pressure 100.  The patient was awake and  breathing.   She was admitted to [facility].   PAST MEDICAL HISTORY:  1.  Coronary disease with a catheterization in 2003 with no coronary      disease, normal left ventricular function.  2.  Non-insulin-dependent diabetes mellitus.  3.  Hypertension.  4.  Gastroesophageal reflux disease.  5.  Recent polypectomy.  6.  Valvular heart disease, moderate MR by echocardiogram of July 25, 2002.   OUTPATIENT MEDICATIONS:  1.  Isosorbide 30.  2.  Atenolol 25 daily.  3.  Prevacid 30 daily.  4.  Aspirin 81 daily.  5.  Synthroid 112 mcg daily.  6.  Clonidine 0.3 b.i.d., but she had stopped taking that before the polyp      procedure.  7.  Lasix 40 daily.  8.  Zocor 20 daily.  9.  Clarinex 5 mg daily.  10. Metaglip 5/500 b.i.d.  11. Lotrel 520 daily.   ALLERGIES:  PENICILLIN.   FAMILY HISTORY:  See H&P.   SOCIAL HISTORY:  See H&P.   REVIEW OF SYSTEMS:  See H&P.   PHYSICAL EXAMINATION AT DISCHARGE:  VITAL SIGNS:  Blood pressure 123/80,  pulse 71, respirations 18, temperature 98.5, oxygen saturation on room air  96%.  She was in sinus rhythm at 80 at  atrial pacing.  LUNGS:  A few crackles in the bases.  HEART:  Regular rate and rhythm with a murmur.   At one point, the patient had been debilitated enough that a skilled nursing  facility was considered or [facility], but due to the long hospitalization, she  was felt to be discharged to home.  She will go home with her daughter for  the weekend and then home health will be able to come out on Monday for her.   Please note, all her new medications were called into [facility].   LABORATORY DATA:  Hemoglobin on admission 9.3, hematocrit 33.3, WBC 12.8,  platelets 183, MCV 84.  These remained stable through hospitalization.  The  patient was heparin throughout her hospital stay.  Chemistry on admission  revealed a sodium of 40, potassium 3.4, chloride 105, CO2 22, glucose 142,  BUN 20, creatinine 1.1, calcium 8.6, total protein 5.3, albumin 2.8.  AST  23, ALT less than 8, ALP 42, total bilirubin 0.6, magnesium 2.0.  These  remained stable.  Prior to discharge, sodium was 134, potassium 4.3,  chloride 99, CO2 26, glucose was up at 253, BUN 30, creatinine 1.5, calcium  9.8.  Glycohemoglobin 5.5.  Cardiac enzymes CK 80, 54, and 61.  MB 1.8, 1.5.  Troponin I 0.02.  TSH 2.843, free T4 1.37.   Chest x-ray:  I do not have the original, but after pacer insertion, left  pacemaker placement with leads overlying the right atrium and right  ventricle and no complicating features.  2-D echocardiogram:  EF 55-65%,  aortic valve thickness was mildly increased, the ascending aorta appeared to  be dilated but unconfirmed on this report, mild mitral annular  calcification.   HOSPITAL COURSE:  Ms. Peet presented to the ER after a syncopal episode in  complete heart block with hypotension on August 08, 2005.  She was admitted  by Dr. Lenise Herald.  She was given atropine in the ER, with improvement  of her symptoms.  She had an external pacemaker in place.  She was admitted to the ICU for  observation.   The patient continued to be stable off of her atenolol and  clonidine, though  we were not sure that she was taking the clonidine.  She continued to be  monitored and stable, with plans for permanent pacemaker.  She had  paroxysmal atrial fibrillation as well.  She was placed on IV Cardizem on  p.r.n. basis for the atrial fibrillation.   Pacemaker was placed on August 14, 2005 for sick sinus syndrome.  She  tolerated the implantation of the pacemaker well.  Physical therapy was  ordered for her after the placement.  She was doing well with physical  therapy.  She was not considered a Coumadin candidate prior to the  admission, and Dr. Jacinto Halim did not feel that she was a Plavix candidate.  She  did go home on aspirin.   By August 15, 2005, the diltiazem was weaned off.  She had the permanent  pacemaker in place.  Norvasc was discontinued.  Lotrel was changed to  Lotensin only, and she was placed on p.o. Cardizem.   The patient condition to improve.  She was able to ambulate, and by August 18, 2005, she was stable and ready for discharge home. She will follow up  with Dr. Clarene Duke and Dr. Jenne Campus as an outpatient.  She was seen by Dr. Tresa Endo  on the day of discharge and felt ready to be discharged.      Darcella Gasman. Ingold, N.P.    ______________________________  Thereasa Solo Little, [name]    LRI/MEDQ  D:  08/18/2005  T:  08/20/2005  Job:  [id number]   cc:   Thereasa Solo. Little, [name]  [address]  [address]  [place]  [place] [address]  Fax: [phone]   Darlin Priestly, MD  [phone]  [place]  [place] [address]  Fax: [phone]   [name], [name]  [address]  [place]  [place] [address]  Fax: [phone]

## 2011-05-12 NOTE — Cardiovascular Report (Signed)
[name], [name]                       ACCOUNT NO.:  [id number]   MEDICAL RECORD NO.:  [id number]                   PATIENT TYPE:  OIB   LOCATION:  5725                                 FACILITY:  [facility]   PHYSICIAN:  Thereasa Solo. Little, [name]              DATE OF BIRTH:  11-Jan-1925   DATE OF PROCEDURE:  09/23/2002  DATE OF DISCHARGE:  09/24/2002                              CARDIAC CATHETERIZATION   INDICATIONS FOR PROCEDURE:  The patient is a 77 year old female who has  severe hypertension. She is on multiple medications that has been difficult  to control.  She had a nuclear study because of continued complaints of  tiredness, fatigue, no energy that showed anterior lateral ischemia.  Because of this she is brought in for outpatient cardiac catheterization.  A  distal aortogram was planned because of her severe hypertension.   DESCRIPTION OF PROCEDURE:  The patient was prepped and draped in the usual  sterile fashion exposing the right groin. Following local anesthetic with 1%  Xylocaine, the Seldinger technique was employed and a 5 [place] introducer  sheath was placed into the right femoral artery.  Left and right coronary  arteriography, ventriculography in the RAO projection and a distal aortogram  was performed.   COMPLICATIONS:  None.   EQUIPMENT:  The 5 French Judkins configuration catheters.   RESULTS:  1. Hemodynamic monitoring:  Central aortic pressure 190/94, left ventricular     pressure 189/17 with no aortic valve gradient noted at time of pullback.  2. Ventriculography:  Ventriculography in the RAO projection revealed marked     left ventricular hypertrophy. Normal left ventricular systolic function     was seen.  Frequent PVCs occurred during the ventriculogram and no exact     ejection fraction could be calculated.  The end-diastolic pressure ranged     from 25 to 28 and the aortic root appeared to be dilated.   DISTAL AORTOGRAM:  A distal aortogram using  30 cc of contrast at 15 cc/sec.  revealed good opacification of the aorta.  There was no abdominal aortic  aneurysm and the renal arteries appeared to be normal.  No significant renal  artery stenosis was seen.   CORONARY ARTERIOGRAPHY:  Minimal calcification in the aortic root of the  ostia of the RCA was seen on fluoroscopy.  1. Left main:  Normal.  2. LAD:  The LAD extends down to the apex of the heart and gave rise to two     small diagonal branches. It showed mild, minimal irregularity.  3. Circumflex:  The circumflex had two OMs, all of which were free of     disease.  4. Right coronary artery:  The RCA gave rise to a PDA and a small     posterolateral branch with minimal irregularities.   CONCLUSION:  1. No significant coronary artery disease.  2. Marked left ventricular hypertrophy.  3. Dilatation of the  aortic root.  4. No abdominal aortic aneurysm.  5. No renal artery stenosis.   The patient was given 20 mg of IV labetalol because of her hypertension in  the catheterization lab.  She will be maintained on her outpatient  medications which include Cardizem 180 mg LA daily, Lasix 80 mg b.i.d.,  potassium 20 mEq b.i.d., Lipitor 10 mEq a day, Synthroid 0.1 mg a day,  Diovan 320 mg a day, and Clonidine 0.3 mg 1/2 tablet b.i.d.  If her blood  pressure remains elevated would consider increasing the dose of her Cardizem  to 360.                                                    Thereasa Solo. Little, [name]    ABL/MEDQ  D:  09/23/2002  T:  09/25/2002  Job:  [id number]   cc:   Cardiac Catheterization Laboratory   [name], [name]

## 2011-05-13 LAB — CBC
HCT: 40.6 % (ref 36.0–46.0)
MCV: 89 fL (ref 78.0–100.0)
RBC: 4.56 MIL/uL (ref 3.87–5.11)
WBC: 5.5 10*3/uL (ref 4.0–10.5)

## 2011-05-13 LAB — DIFFERENTIAL
Lymphocytes Relative: 27 % (ref 12–46)
Lymphs Abs: 1.5 10*3/uL (ref 0.7–4.0)
Monocytes Relative: 10 % (ref 3–12)
Neutrophils Relative %: 60 % (ref 43–77)

## 2011-05-13 LAB — COMPREHENSIVE METABOLIC PANEL
Alkaline Phosphatase: 56 U/L (ref 39–117)
BUN: 16 mg/dL (ref 6–23)
CO2: 33 mEq/L — ABNORMAL HIGH (ref 19–32)
Chloride: 102 mEq/L (ref 96–112)
GFR calc non Af Amer: 39 mL/min — ABNORMAL LOW (ref >60)
Glucose, Bld: 110 mg/dL — ABNORMAL HIGH (ref 70–99)
Potassium: 3.3 mEq/L — ABNORMAL LOW (ref 3.5–5.1)
Total Bilirubin: 0.4 mg/dL (ref 0.3–1.2)

## 2011-05-13 LAB — GLUCOSE, CAPILLARY
Glucose-Capillary: 158 mg/dL — ABNORMAL HIGH (ref 70–99)
Glucose-Capillary: 91 mg/dL (ref 70–99)

## 2011-05-13 LAB — TSH: TSH: 3.047 u[IU]/mL (ref 0.350–4.500)

## 2011-05-14 ENCOUNTER — Observation Stay (HOSPITAL_COMMUNITY): Payer: Medicare Other

## 2011-05-14 LAB — CBC
HCT: 37.4 % (ref 36.0–46.0)
Hemoglobin: 11.8 g/dL — ABNORMAL LOW (ref 12.0–15.0)
MCH: 28.1 pg (ref 26.0–34.0)
MCHC: 31.6 g/dL (ref 30.0–36.0)

## 2011-05-14 LAB — BASIC METABOLIC PANEL
CO2: 29 mEq/L (ref 19–32)
Calcium: 9.4 mg/dL (ref 8.4–10.5)
Glucose, Bld: 99 mg/dL (ref 70–99)
Sodium: 139 mEq/L (ref 135–145)

## 2011-05-14 LAB — CARDIAC PANEL(CRET KIN+CKTOT+MB+TROPI)
Relative Index: INVALID (ref 0.0–2.5)
Total CK: 42 U/L (ref 7–177)
Troponin I: <0.3 ng/mL (ref <0.30)
Troponin I: <0.3 ng/mL (ref <0.30)

## 2011-05-14 LAB — GLUCOSE, CAPILLARY
Glucose-Capillary: 124 mg/dL — ABNORMAL HIGH (ref 70–99)
Glucose-Capillary: 99 mg/dL (ref 70–99)

## 2011-05-15 LAB — GLUCOSE, CAPILLARY: Glucose-Capillary: 82 mg/dL (ref 70–99)

## 2011-05-15 LAB — CBC
HCT: 34.5 % — ABNORMAL LOW (ref 36.0–46.0)
MCH: 29.2 pg (ref 26.0–34.0)
MCHC: 33 g/dL (ref 30.0–36.0)
MCV: 88.5 fL (ref 78.0–100.0)
RDW: 14.9 % (ref 11.5–15.5)

## 2011-05-15 LAB — BASIC METABOLIC PANEL
BUN: 12 mg/dL (ref 6–23)
Calcium: 9.5 mg/dL (ref 8.4–10.5)
GFR calc non Af Amer: 45 mL/min — ABNORMAL LOW (ref >60)
Glucose, Bld: 89 mg/dL (ref 70–99)

## 2011-05-16 LAB — GLUCOSE, CAPILLARY
Glucose-Capillary: 125 mg/dL — ABNORMAL HIGH (ref 70–99)
Glucose-Capillary: 84 mg/dL (ref 70–99)

## 2011-05-16 LAB — BASIC METABOLIC PANEL
CO2: 27 mEq/L (ref 19–32)
Calcium: 9.6 mg/dL (ref 8.4–10.5)
GFR calc Af Amer: 58 mL/min — ABNORMAL LOW (ref >60)
GFR calc non Af Amer: 48 mL/min — ABNORMAL LOW (ref >60)
Potassium: 3.5 mEq/L (ref 3.5–5.1)
Sodium: 138 mEq/L (ref 135–145)

## 2011-05-16 LAB — URINE CULTURE: Colony Count: >=100000

## 2011-05-16 LAB — CBC
Hemoglobin: 11.5 g/dL — ABNORMAL LOW (ref 12.0–15.0)
MCHC: 31.7 g/dL (ref 30.0–36.0)
RDW: 14.7 % (ref 11.5–15.5)
WBC: 4.4 10*3/uL (ref 4.0–10.5)

## 2011-05-23 NOTE — H&P (Signed)
[name], [name]             ACCOUNT NO.:  [id number]  MEDICAL RECORD NO.:  [id number]           PATIENT TYPE:  E  LOCATION:  WLED                         FACILITY:  [facility]  PHYSICIAN:  Houston Siren, MD           DATE OF BIRTH:  [birth year]  DATE OF ADMISSION:  05/12/2011 DATE OF DISCHARGE:                             HISTORY & PHYSICAL   PRIMARY CARE PHYSICIAN:  Lenon Curt. Chilton Si, [name]  REASON FOR ADMISSION:  Abdominal pain and altered mental status.  ADVANCE DIRECTIVE:  Presumed full code.  HISTORY OF PRESENT ILLNESS:  This is an 77 year old female with multiple medical problems including atrial fibrillation, on medical regimen, but no anticoagulation; recent UTI with Klebsiella resistant organism; resolved acute renal failure; prior altered mental status; hypoglycemia; urinary retention; nursing home resident; brought in because of altered mental status and abdominal pain.  Workup showed elevated creatinine up to 1.84, this is new for her, and a positive urinalysis with 7 to 10 WBCs and large leukocyte esterase.  She has a normal white count of 4900 and normal hemoglobin.  Her head CT is negative, and her blood glucose is 138.  She was given intravenous fluids, and by the time I saw her, she is able to tell me that she is at [employer] and conversed normally. Hospitalist was asked to admit the patient because of elevated creatinine, presumably from dehydration, and UTI.  It was also reported that she has a chronic Foley which has not been changed for over a month.  PAST MEDICAL HISTORY:  UTI with E coli resistant UTI with Klebsiella; history of atrial fibrillation, not on anticoagulants; constipation; history of colitis; known coronary artery disease; type 2 diabetes; GERD; hypothyroidism; hypertension; hyperlipidemia; history of congestive heart failure; history of urinary incontinence, on chronic Foley catheter.  PAST SURGICAL HISTORY:  Humeral head and subcapital  fracture repair in 2010, stent placement in 2008 secondary to MI, status post pacemaker placement in 2008.  CURRENT MEDICATIONS:  Amiodarone, aspirin, calcium, Dilaudid, diltiazem, glipizide, Isordil, labetalol, Lasix, Levoxyl, MiraLax, Nexium, Phenergan, Plavix, Remeron, Senokot, and simvastatin.  ALLERGIES:  LIPITOR, although she is on simvastatin, and PENICILLIN.  REVIEW OF SYSTEMS:  Otherwise unremarkable.  FAMILY HISTORY:  Noncontributory.  PHYSICAL EXAMINATION:  VITAL SIGNS:  Blood pressure 121/72, pulse of 98, respiratory rate of 20, temperature 98.7. GENERAL:  Exam shows that she is alert and oriented, conversing. HEENT:  She has facial symmetry.  Speech is fluent.  Tongue is midline. NECK:  Supple. SKIN:  Warm and dry. CARDIAC EXAM:  Revealed S1, S2.  Regular.  I did not hear any murmur, rub or gallop. LUNGS:  Clear. ABDOMINAL EXAM:  Soft, nondistended, nontender.  No palpable mass.  She has chronic Foley catheter.  She is moving all 4 extremity.  There is no edema and good distal pulses.  No calf tenderness.  OBJECTIVE FINDINGS:  White count 4900, hemoglobin of 13.9, platelet count of 141,000.  Urinalysis showed positive nitrite, large esterase, 7 to 10 WBCs and rare bacteria.  Creatinine of 1.84, BUN of 24, potassium of 3.5.  Serum sodium 138.  Head CT is negative.  IMPRESSION:  This is an 77 year old nursing home resident with multiple medical problems including atrial fibrillation, resistant Klebsiella urinary tract infection in the past, acute renal failure, hypoglycemia, urinary retention and on chronic Foley catheter, admitted because of altered mental status, urinary tract infection and dehydration.  I suspect her elevated creatinine is prerenal azotemia.  For her UTI despite elevated creatinine, she is allergic to penicillin, and we will stop the imipenem given in the emergency room.  I will go ahead and dose her with gentamicin.  We will follow her  creatinine, change her Foley over to the new one.  She is a presumed full code and will be admitted to [facility].  For her diabetes, we will continue her medications and check blood glucose q.a.c. and h.s.  As soon as her medications are reconciled, will continue them all except for the Lasix.     Houston Siren, MD     PL/MEDQ  D:  05/12/2011  T:  05/13/2011  Job:  [id number]  cc:   Lenon Curt. Chilton Si, [name] Fax: [phone]  Electronically Signed by Houston Siren  on 05/23/2011 04:22:35 AM

## 2011-05-24 NOTE — Discharge Summary (Addendum)
[name], [name]             ACCOUNT NO.:  [id number]  MEDICAL RECORD NO.:  [id number]           PATIENT TYPE:  I  LOCATION:  1301                         FACILITY:  [facility]  PHYSICIAN:  Marcellus Scott, MD     DATE OF BIRTH:  10-01-1925  DATE OF ADMISSION:  05/12/2011 DATE OF DISCHARGE:  05/16/2011                              DISCHARGE SUMMARY   PRIMARY CARE PROVIDER:  Lenon Curt. Chilton Si, MD  DISCHARGE DIAGNOSES: 1. Toxic metabolic encephalopathy. 2. Multiple-drug resistant urinary tract infection. 3. Dehydration. 4. Acute renal insufficiency. 5. Diabetes mellitus type 2. 6. Atrial fibrillation. 7. Hypokalemia. 8. Questionable chest pain. 9. History of chronic diastolic heart failure. 10.Dementia.  DISCHARGE MEDICATIONS: 1. Invanz 1 g IV daily, discontinue after May 22, 2011 dose. 2. Amiodarone 200 mg 1-1/2 tabs p.o. daily. 3. Artificial tears 1 drop both eyes every 2 hours as needed for dry     eyes. 4. Aspirin enteric-coated 81 mg p.o. 1 tablet daily. 5. Calcium 600 mg/vitamin D 200 mg 1 tablet p.o. b.i.d. 6. Diltiazem CD/XT 360 mg p.o. daily. 7. Glipizide 2.5 mg p.o. daily. 8. Isosorbide mononitrate XR 30 mg p.o. daily. 9. Labetalol 200 mg p.o. t.i.d. 10.Lasix 40 mg p.o. daily. 11.Levothyroxine 125 mcg p.o. daily. 12.MiraLax 17 g p.o. daily. 13.Nexium 40 mg p.o. daily. 14.Plavix 75 mg p.o. daily. 15.Remeron 7.5 mg p.o. daily at bedtime, began a 90-day course on May 08, 2011. 16.Senokot-S 1 tablet p.o. b.i.d. 17.Simvastatin 10 mg p.o. daily at bedtime. 18.Vesicare 10 mg p.o. daily.  DIAGNOSTIC LABORATORY DATA:  WBC 4.9, hemoglobin 13.9, hematocrit 42.9, platelets 141.  Sodium 138, potassium 3.5, chloride 97, CO2 of 32, BUN 24, creatinine 1.84, glucose 138, calcium 10.6.  Urinalysis with large leukocytes, positive nitrites, 30 protein, large blood, turbid appearance, rare bacteria, 7 to 10 WBCs with clumps.  MRSA PCR screening positive.  TSH 3.047.  Total  CK 44, CK-MB 1.9, troponin I less than 0.30.  Second set, total CK 42, CK-MB 1.9, troponin I less than 0.30. Total CK 36, CK-MB 1.8, troponin I less than 0.30.  Urine culture yield E coli as well as ESBL, Klebsiella pneumoniae.  DIAGNOSTIC RADIOLOGY:  CT of the head yield no acute findings.  Chest x- ray done on May 20th yield no active cardiopulmonary disease.  CONSULTATIONS:  None.  PROCEDURES:  None.  BRIEF HISTORY:  [name] is an 77 year old female with multiple medical problems including AFib, on medical regimen, but no anticoagulation; recent UTI with Klebsiella organism; resolved acute renal failure; prior altered mental status; hypoglycemia; urinary retention, is a nursing home resident, who was brought to the [facility] ED because of altered mental status and abdominal pain.  Workup in the emergency room showed an elevated creatinine up to 1.8 which was new for her and a positive urinalysis with 7 to 10 WBCs and large leukocyte esterase.  She had a normal white count of 4900 and a normal hemoglobin. Her head CT was negative and her blood glucose was 138.  She was given IV fluids and when examined by the hospitalist administrator was alert and able  to identify where she was and converse normally.  The patient was admitted by the hospitalist because of elevated creatinine, presumably from dehydration and UTI.  It was also reported that she has a chronic Foley which had not been changed in over a month.  HOSPITAL COURSE BY PROBLEM: 1. Toxic metabolic encephalopathy secondary to presumed UTI, currently     resolved.  The patient is possibly at her baseline. 2. Multidrug-resistant urinary tract infection, E coli, ESBL,     Klebsiella pneumoniae.  The patient is a nursing home resident with     chronic indwelling Foley.  She presented with no fever and no white     count, but it was reported that the catheter had not been changed.     The catheter was changed in the  emergency room.  Because of this     scenario, it is difficult to determine if UTI is true or     colonization.  Apart from the continued IV antibiotics that the     patient will continue to get until May 28th, it may be prudent to     assess why the patient has a chronic Foley in the nursing home and     if it is needed.  If it is determined that is not needed,     considering discontinuing the Foley is recommended.  It is felt     that repeating a urine once the antibiotics are discontinued would     not be that helpful as the same organisms will be there.  The     patient will go to facility on Invanz 1 g IV for 7 days.  This will     yield a total of 10-day antibiotic therapy. 3. Dehydration, resolved. 4. Acute renal failure, resolved.  The patient's creatinine on the day     of discharge is 1.09. 5. Diabetes mellitus type 2, controlled while in the hospital.  The     patient was on sliding scale and required only 1 unit of insulin     during her entire stay.  We will continue her glipizide. 6. AFib, rate control.  Continue Plavix. 7. Hypokalemia, repleted and resolved. 8. Questionable chest pain.  Workup was negative. 9. History of chronic diastolic heart failure.  We will continue her     Lasix 10.Dementia.  It is felt the patient is possibly at her baseline.  PHYSICAL EXAMINATION:  VITAL SIGNS:  Temperature 97.9, blood pressure 163/77, heart rate 61, respirations 20, saturation 97% on room air. GENERAL:  Awake, alert, calm. CV:  Irregularly irregular.  No murmur, gallop, or rub.  No lower extremity edema. RESPIRATORY:  Normal effort.  Breath sounds clear to auscultation bilaterally.  No rhonchi, wheezes, or rales. ABDOMEN:  Flat, soft, positive bowel sounds throughout, nontender to palpation. NEURO:  Alert, oriented to self only.  FOLLOWUP: 1. Diet.  Heart healthy. 2. Activity.  Out of bed with assistance to increase gradually. 3. Disposition.  The patient is medically  stable and ready to be     discharged to the nursing facility.  The patient is a full code.  It was truly a pleasure taking care of [name].  Time spent on this discharge 40 minutes.     Gwenyth Bender, NP   ______________________________ Marcellus Scott, MD    KMB/MEDQ  D:  05/16/2011  T:  05/16/2011  Job:  [id number]  cc:   Lenon Curt Chilton Si, [name] Fax: [phone]  Electronically Signed  by Toya Smothers  on 05/24/2011 04:15:11 PM Electronically Signed by Marcellus Scott MD on 06/16/2011 10:14:47 PM

## 2011-05-27 ENCOUNTER — Emergency Department (HOSPITAL_COMMUNITY)
Admission: EM | Admit: 2011-05-27 | Discharge: 2011-05-27 | Disposition: A | Discharge status: Home / Self Care | Payer: Medicare Other | Attending: Emergency Medicine | Admitting: Emergency Medicine

## 2011-05-27 DIAGNOSIS — E039 Hypothyroidism, unspecified: Secondary | ICD-10-CM | POA: Insufficient documentation

## 2011-05-27 DIAGNOSIS — E119 Type 2 diabetes mellitus without complications: Secondary | ICD-10-CM | POA: Insufficient documentation

## 2011-05-27 DIAGNOSIS — I2510 Atherosclerotic heart disease of native coronary artery without angina pectoris: Secondary | ICD-10-CM | POA: Insufficient documentation

## 2011-05-27 DIAGNOSIS — K219 Gastro-esophageal reflux disease without esophagitis: Secondary | ICD-10-CM | POA: Insufficient documentation

## 2011-05-27 DIAGNOSIS — R55 Syncope and collapse: Secondary | ICD-10-CM | POA: Insufficient documentation

## 2011-05-27 DIAGNOSIS — E785 Hyperlipidemia, unspecified: Secondary | ICD-10-CM | POA: Insufficient documentation

## 2011-05-27 DIAGNOSIS — N390 Urinary tract infection, site not specified: Secondary | ICD-10-CM | POA: Insufficient documentation

## 2011-05-27 LAB — CBC
HCT: 37 % (ref 36.0–46.0)
Hemoglobin: 11.9 g/dL — ABNORMAL LOW (ref 12.0–15.0)
MCH: 28.5 pg (ref 26.0–34.0)
MCHC: 32.2 g/dL (ref 30.0–36.0)
MCV: 88.5 fL (ref 78.0–100.0)
Platelets: 187 10*3/uL (ref 150–400)
RBC: 4.18 MIL/uL (ref 3.87–5.11)
RDW: 14.9 % (ref 11.5–15.5)
WBC: 6.9 K/uL (ref 4.0–10.5)

## 2011-05-27 LAB — DIFFERENTIAL
Basophils Absolute: 0 10*3/uL (ref 0.0–0.1)
Basophils Relative: 1 % (ref 0–1)
Eosinophils Absolute: 0.2 K/uL (ref 0.0–0.7)
Eosinophils Relative: 2 % (ref 0–5)
Lymphocytes Relative: 18 % (ref 12–46)
Lymphs Abs: 1.2 K/uL (ref 0.7–4.0)
Monocytes Absolute: 0.5 10*3/uL (ref 0.1–1.0)
Monocytes Relative: 8 % (ref 3–12)
Neutro Abs: 5 K/uL (ref 1.7–7.7)
Neutrophils Relative %: 72 % (ref 43–77)

## 2011-05-27 LAB — URINALYSIS, ROUTINE W REFLEX MICROSCOPIC
Bilirubin Urine: NEGATIVE
Glucose, UA: NEGATIVE mg/dL
Ketones, ur: NEGATIVE mg/dL
Nitrite: NEGATIVE
Protein, ur: 100 mg/dL — AB
Specific Gravity, Urine: 1.013 (ref 1.005–1.030)
Urobilinogen, UA: 0.2 mg/dL (ref 0.0–1.0)
pH: 6.5 (ref 5.0–8.0)

## 2011-05-27 LAB — URINE MICROSCOPIC-ADD ON

## 2011-05-27 LAB — CK TOTAL AND CKMB (NOT AT ARMC)
CK, MB: 2.1 ng/mL (ref 0.3–4.0)
Relative Index: 1.4 (ref 0.0–2.5)
Total CK: 146 U/L (ref 7–177)

## 2011-05-27 LAB — BASIC METABOLIC PANEL
Calcium: 9.5 mg/dL (ref 8.4–10.5)
GFR calc non Af Amer: 35 mL/min — ABNORMAL LOW (ref >60)
Glucose, Bld: 114 mg/dL — ABNORMAL HIGH (ref 70–99)
Sodium: 139 mEq/L (ref 135–145)

## 2011-05-27 LAB — BASIC METABOLIC PANEL WITH GFR
BUN: 18 mg/dL (ref 6–23)
CO2: 29 meq/L (ref 19–32)
Chloride: 99 meq/L (ref 96–112)
Creatinine, Ser: 1.42 mg/dL — ABNORMAL HIGH (ref 0.4–1.2)
GFR calc Af Amer: 43 mL/min — ABNORMAL LOW (ref >60)
Potassium: 3.5 meq/L (ref 3.5–5.1)

## 2011-05-27 LAB — GLUCOSE, CAPILLARY: Glucose-Capillary: 114 mg/dL — ABNORMAL HIGH (ref 70–99)

## 2011-05-27 LAB — TROPONIN I: Troponin I: <0.3 ng/mL (ref <0.30)

## 2011-05-28 LAB — URINE CULTURE
Colony Count: 80000
Culture  Setup Time: 201206021915

## 2011-06-07 ENCOUNTER — Telehealth: Payer: Self-pay

## 2011-06-07 NOTE — Telephone Encounter (Signed)
Spoke with [name] at [facility] at [place]. Pts primary care physician would like for the pt to see Dr. Arlyce Dice to have a G tube placed, pt is not eating. Does pt need an OV or should she be scheduled for a direct procedure. Dr. Arlyce Dice please advise.

## 2011-06-07 NOTE — Telephone Encounter (Signed)
Needs OV.  

## 2011-06-08 NOTE — Telephone Encounter (Signed)
Either overbook or have a midlevel see him

## 2011-06-08 NOTE — Telephone Encounter (Signed)
Dr. Arlyce Dice the first available appt with you is July 17th unless we overbook. Please advise. Should we overbook or bring pt in to see midlevel?

## 2011-06-09 NOTE — Telephone Encounter (Signed)
Pt scheduled to see Willette Cluster NP 06/19/11@2pm , Dr. Arlyce Dice is the supervising physician. Per Reuel Boom pt has bad dementia. Instructed Reuel Boom to make sure that the pts family came with the pt to the visit, without the family we would not be able to see her. Reuel Boom is to let us know if family cannot come.

## 2011-06-19 ENCOUNTER — Encounter: Payer: Self-pay | Admitting: Nurse Practitioner

## 2011-06-19 ENCOUNTER — Other Ambulatory Visit: Payer: Self-pay | Admitting: *Deleted

## 2011-06-19 ENCOUNTER — Ambulatory Visit (INDEPENDENT_AMBULATORY_CARE_PROVIDER_SITE_OTHER): Payer: Medicare Other | Admitting: Nurse Practitioner

## 2011-06-19 VITALS — BP 124/60 | HR 78

## 2011-06-19 DIAGNOSIS — R634 Abnormal weight loss: Secondary | ICD-10-CM

## 2011-06-19 DIAGNOSIS — R638 Other symptoms and signs concerning food and fluid intake: Secondary | ICD-10-CM

## 2011-06-19 DIAGNOSIS — Z7902 Long term (current) use of antithrombotics/antiplatelets: Secondary | ICD-10-CM

## 2011-06-19 DIAGNOSIS — R6889 Other general symptoms and signs: Secondary | ICD-10-CM

## 2011-06-19 DIAGNOSIS — D699 Hemorrhagic condition, unspecified: Secondary | ICD-10-CM

## 2011-06-19 DIAGNOSIS — D689 Coagulation defect, unspecified: Secondary | ICD-10-CM

## 2011-06-19 NOTE — Patient Instructions (Signed)
We have scheduled the Endoscopy with Peg Tube Placement on 07-24-2011. STAY ON THE PLAVIX FOR 7 DAYS PRIOR, STOP THE PLAVIX ON 07-17-2011.  We will call you if there is a sooner appointment and still have time for [name] to be off the Plavix for 7 days.

## 2011-06-19 NOTE — Assessment & Plan Note (Signed)
Chronic Plavix which will need to be held for at least 5 days prior to PEG tube placement.

## 2011-06-19 NOTE — Assessment & Plan Note (Addendum)
Poor oral intake with significant weight loss over the last 4 months. Per the patient's daughter, no dysphagia or abdominal pain. Her poor appetite may be multifactorial (altered mental status, possibly depression, maybe medications, etc.). Discussed PEG tube placement with daughter. Patient has been too weak to participate in rehabilitation at [facility]. Daughter hopes that by improving her nutritional status, patient will be able to participate in rehabilitation program. Will schedule for PEG tube placement. The risk, benefits were discussed. CBC from 05/26/2011 was unremarkable. I do not see a recent prothrombin time which I will obtain today in anticipation of PEG tube placement. Patient will need to be off Plavix for 5 days prior to procedure and we will contact her primary care physician regarding this matter.

## 2011-06-19 NOTE — Progress Notes (Signed)
[name] [id number] 1925/11/14   HISTORY OR PRESENT ILLNESS : [name] is an 77 year old female known to Dr. Arlyce Dice for history of gastritis, gastroparesis and adenomatous colon polyps. Patient was last seen at the time of her surveillance colonoscopy in April 2011. Patient was hospitalized for a fractured hip n February of this year. She went to [facility] post hospitalization for rehabilitation. According to the daughter, the patient failed to progress in her rehabilitation. She's been in and out of the hospital since with recurrent urinary tract infections. Patient is confused her daughter gives the history. Sounds like the patient was recently diagnosed with dementia. According to the daughter, patient has no dysphasia, at least to a soft diet. Main problem is lack of interest in food. Daughter wonders if depression contributing to poor appetite. Patient was fairly independent at home prior to falling and fracturing hip.  Weight down from 168 to 111 over the last several months. Patient's primary care physician has referred patient to [place] for PEG tube placement.  CBC earlier this month was unremarkable. Labs in late May 2012 pertinent for creatinine 1.3,  AST 84,  ALT 62,  and albumin of 3.3.   Current Medications, Allergies, Past Medical History, Past Surgical History, Family History and Social History were reviewed in [employer] record.   PHYSICAL EXAMINATION : General: Thin black female in a wheelchair in no acute distress Head: Normocephalic and atraumatic Eyes:  sclerae anicteric,conjunctive pink. Ears: Normal auditory acuity Mouth: No deformity or lesions Neck: Supple, no masses.  Lungs: Clear throughout to auscultation Heart: Regular rate and rhythm; no murmurs heard Abdomen: Limited examination, patient not ambulatory and unable to get home examination table. Abdomen is soft nondistended. Active bowel sounds Rectal: Not done Musculoskeletal:  Symmetrical with no gross deformities  Skin: No lesions on visible extremities Extremities: No edema or deformities noted Neurological: Confused Cervical Nodes:  No significant cervical adenopathy Psychological:  Alert and cooperative.  ASSESSMENT AND PLAN :

## 2011-06-20 NOTE — Progress Notes (Signed)
Reviewed and agree with management. Clovia Reine D. Laquasia Pincus, [name], FACG  

## 2011-07-03 ENCOUNTER — Emergency Department (HOSPITAL_COMMUNITY)
Admission: EM | Admit: 2011-07-03 | Discharge: 2011-07-04 | Disposition: A | Discharge status: Other Acute Inpatient Hospital | Payer: Medicare Other | Source: Home / Self Care | Attending: Emergency Medicine | Admitting: Emergency Medicine

## 2011-07-03 ENCOUNTER — Emergency Department (HOSPITAL_COMMUNITY): Payer: Medicare Other

## 2011-07-03 DIAGNOSIS — E785 Hyperlipidemia, unspecified: Secondary | ICD-10-CM | POA: Insufficient documentation

## 2011-07-03 DIAGNOSIS — N390 Urinary tract infection, site not specified: Secondary | ICD-10-CM | POA: Insufficient documentation

## 2011-07-03 DIAGNOSIS — N289 Disorder of kidney and ureter, unspecified: Secondary | ICD-10-CM | POA: Insufficient documentation

## 2011-07-03 DIAGNOSIS — E119 Type 2 diabetes mellitus without complications: Secondary | ICD-10-CM | POA: Insufficient documentation

## 2011-07-03 DIAGNOSIS — E870 Hyperosmolality and hypernatremia: Secondary | ICD-10-CM | POA: Insufficient documentation

## 2011-07-03 DIAGNOSIS — E039 Hypothyroidism, unspecified: Secondary | ICD-10-CM | POA: Insufficient documentation

## 2011-07-03 DIAGNOSIS — I2510 Atherosclerotic heart disease of native coronary artery without angina pectoris: Secondary | ICD-10-CM | POA: Insufficient documentation

## 2011-07-03 LAB — COMPREHENSIVE METABOLIC PANEL
ALT: 24 U/L (ref 0–35)
AST: 48 U/L — ABNORMAL HIGH (ref 0–37)
Albumin: 3.1 g/dL — ABNORMAL LOW (ref 3.5–5.2)
Calcium: 10 mg/dL (ref 8.4–10.5)
Creatinine, Ser: 5.41 mg/dL — ABNORMAL HIGH (ref 0.50–1.10)
Sodium: 155 mEq/L — ABNORMAL HIGH (ref 135–145)
Total Protein: 7.2 g/dL (ref 6.0–8.3)

## 2011-07-03 LAB — CBC
MCH: 29.2 pg (ref 26.0–34.0)
MCHC: 31.5 g/dL (ref 30.0–36.0)
MCV: 92.8 fL (ref 78.0–100.0)
Platelets: 133 10*3/uL — ABNORMAL LOW (ref 150–400)
RBC: 3.87 MIL/uL (ref 3.87–5.11)
RDW: 15.8 % — ABNORMAL HIGH (ref 11.5–15.5)

## 2011-07-03 LAB — URINE MICROSCOPIC-ADD ON

## 2011-07-03 LAB — CK TOTAL AND CKMB (NOT AT ARMC)
CK, MB: 4.2 ng/mL — ABNORMAL HIGH (ref 0.3–4.0)
Relative Index: INVALID (ref 0.0–2.5)

## 2011-07-03 LAB — DIFFERENTIAL
Basophils Relative: 0 % (ref 0–1)
Eosinophils Absolute: 0.1 10*3/uL (ref 0.0–0.7)
Eosinophils Relative: 2 % (ref 0–5)
Monocytes Absolute: 0.6 10*3/uL (ref 0.1–1.0)
Monocytes Relative: 8 % (ref 3–12)
Neutrophils Relative %: 64 % (ref 43–77)

## 2011-07-03 LAB — URINALYSIS, ROUTINE W REFLEX MICROSCOPIC
Bilirubin Urine: NEGATIVE
Glucose, UA: NEGATIVE mg/dL
Ketones, ur: NEGATIVE mg/dL
pH: 7.5 (ref 5.0–8.0)

## 2011-07-03 LAB — MAGNESIUM: Magnesium: 2.6 mg/dL — ABNORMAL HIGH (ref 1.5–2.5)

## 2011-07-03 LAB — TROPONIN I: Troponin I: <0.3 ng/mL (ref <0.30)

## 2011-07-04 ENCOUNTER — Inpatient Hospital Stay (HOSPITAL_COMMUNITY)
Admission: EM | Admit: 2011-07-04 | Discharge: 2011-07-09 | DRG: 682 | Disposition: A | Discharge status: Skilled Nursing Facility | Payer: Medicare Other | Source: Ambulatory Visit | Attending: Internal Medicine | Admitting: Internal Medicine

## 2011-07-04 DIAGNOSIS — N179 Acute kidney failure, unspecified: Principal | ICD-10-CM | POA: Diagnosis present

## 2011-07-04 DIAGNOSIS — I2510 Atherosclerotic heart disease of native coronary artery without angina pectoris: Secondary | ICD-10-CM | POA: Diagnosis present

## 2011-07-04 DIAGNOSIS — N390 Urinary tract infection, site not specified: Secondary | ICD-10-CM | POA: Diagnosis present

## 2011-07-04 DIAGNOSIS — I509 Heart failure, unspecified: Secondary | ICD-10-CM | POA: Diagnosis present

## 2011-07-04 DIAGNOSIS — Z66 Do not resuscitate: Secondary | ICD-10-CM | POA: Diagnosis present

## 2011-07-04 DIAGNOSIS — I5032 Chronic diastolic (congestive) heart failure: Secondary | ICD-10-CM | POA: Diagnosis present

## 2011-07-04 DIAGNOSIS — F0390 Unspecified dementia without behavioral disturbance: Secondary | ICD-10-CM | POA: Diagnosis present

## 2011-07-04 DIAGNOSIS — Z515 Encounter for palliative care: Secondary | ICD-10-CM

## 2011-07-04 DIAGNOSIS — E119 Type 2 diabetes mellitus without complications: Secondary | ICD-10-CM | POA: Diagnosis present

## 2011-07-04 DIAGNOSIS — Z950 Presence of cardiac pacemaker: Secondary | ICD-10-CM

## 2011-07-04 DIAGNOSIS — I252 Old myocardial infarction: Secondary | ICD-10-CM

## 2011-07-04 DIAGNOSIS — R32 Unspecified urinary incontinence: Secondary | ICD-10-CM | POA: Diagnosis present

## 2011-07-04 DIAGNOSIS — G9340 Encephalopathy, unspecified: Secondary | ICD-10-CM | POA: Diagnosis present

## 2011-07-04 DIAGNOSIS — E871 Hypo-osmolality and hyponatremia: Secondary | ICD-10-CM | POA: Diagnosis present

## 2011-07-04 LAB — CBC
MCHC: 31.5 g/dL (ref 30.0–36.0)
Platelets: 122 10*3/uL — ABNORMAL LOW (ref 150–400)
RDW: 16 % — ABNORMAL HIGH (ref 11.5–15.5)
WBC: 7.4 10*3/uL (ref 4.0–10.5)

## 2011-07-04 LAB — GLUCOSE, CAPILLARY
Glucose-Capillary: 90 mg/dL (ref 70–99)
Glucose-Capillary: 103 mg/dL — ABNORMAL HIGH (ref 70–99)

## 2011-07-05 LAB — COMPREHENSIVE METABOLIC PANEL
BUN: 48 mg/dL — ABNORMAL HIGH (ref 6–23)
CO2: 23 mEq/L (ref 19–32)
Calcium: 9 mg/dL (ref 8.4–10.5)
Chloride: 113 mEq/L — ABNORMAL HIGH (ref 96–112)
Creatinine, Ser: 3.19 mg/dL — ABNORMAL HIGH (ref 0.50–1.10)
GFR calc Af Amer: 17 mL/min — ABNORMAL LOW (ref >60)
GFR calc non Af Amer: 14 mL/min — ABNORMAL LOW (ref >60)
Total Bilirubin: 0.4 mg/dL (ref 0.3–1.2)

## 2011-07-05 LAB — GLUCOSE, CAPILLARY
Glucose-Capillary: 61 mg/dL — ABNORMAL LOW (ref 70–99)
Glucose-Capillary: 84 mg/dL (ref 70–99)
Glucose-Capillary: 99 mg/dL (ref 70–99)
Glucose-Capillary: 64 mg/dL — ABNORMAL LOW (ref 70–99)

## 2011-07-05 LAB — CBC
HCT: 32.1 % — ABNORMAL LOW (ref 36.0–46.0)
MCH: 29.1 pg (ref 26.0–34.0)
MCV: 89.7 fL (ref 78.0–100.0)
Platelets: 107 10*3/uL — ABNORMAL LOW (ref 150–400)
RBC: 3.58 MIL/uL — ABNORMAL LOW (ref 3.87–5.11)
WBC: 5.9 10*3/uL (ref 4.0–10.5)

## 2011-07-05 LAB — URINE CULTURE: Culture  Setup Time: 201207100121

## 2011-07-06 LAB — COMPREHENSIVE METABOLIC PANEL
AST: 40 U/L — ABNORMAL HIGH (ref 0–37)
BUN: 50 mg/dL — ABNORMAL HIGH (ref 6–23)
CO2: 23 mEq/L (ref 19–32)
Calcium: 8.8 mg/dL (ref 8.4–10.5)
Chloride: 114 mEq/L — ABNORMAL HIGH (ref 96–112)
Creatinine, Ser: 3.02 mg/dL — ABNORMAL HIGH (ref 0.50–1.10)
GFR calc Af Amer: 18 mL/min — ABNORMAL LOW (ref >60)
GFR calc non Af Amer: 15 mL/min — ABNORMAL LOW (ref >60)
Glucose, Bld: 79 mg/dL (ref 70–99)
Total Bilirubin: 0.4 mg/dL (ref 0.3–1.2)

## 2011-07-06 LAB — GLUCOSE, CAPILLARY
Glucose-Capillary: 76 mg/dL (ref 70–99)
Glucose-Capillary: 78 mg/dL (ref 70–99)

## 2011-07-06 LAB — CBC
HCT: 30.2 % — ABNORMAL LOW (ref 36.0–46.0)
MCHC: 33.1 g/dL (ref 30.0–36.0)
RDW: 15.4 % (ref 11.5–15.5)

## 2011-07-06 LAB — RPR: RPR Ser Ql: REACTIVE — AB

## 2011-07-08 NOTE — Consult Note (Signed)
[name], RITTHALER             ACCOUNT NO.:  [id number]  MEDICAL RECORD NO.:  [id number]  LOCATION:  4524                         FACILITY:  [facility]  PHYSICIAN:  Ashtyn Freilich L. Ladona [name], MD  DATE OF BIRTH:  [birth year]  DATE OF CONSULTATION:  07/06/2011 DATE OF DISCHARGE:                                CONSULTATION   REQUESTING PHYSICIAN:  Dr. Noel Gerold.  REASON FOR CONSULTATION:  Symptom management and forming goals of care as outlined by Dr. Noel Gerold.  HISTORY OF PRESENT ILLNESS:  The patient is an 77 year old African American female with advanced dementia.  She does have some delusional features related to this.  The patient was found to have urinary tract infection with acute renal failure and was admitted to the hospital.  Of note, the patient has had persistent recurring urinary tract infections and has been hospitalized at least monthly for the last 6 months.  The patient persistently unable to eat as she pockets her food and continues to chew.  Continuously, she does not show overt signs of aspiration but is unable to understand the concept of food even know her.  Brain tells her that she wants to eat.  She has been agitated and somewhat combative.  She will not take any of her oral medications and she pulled out all of her IVs.  Dr. Noel Gerold spoke with the patient's daughter and they have decided a comfort care is appropriate for this patient at this time as she is resistant to treatment and has issues that will not be cured.  The patient's daughter is torn over making the choices by herself, but at this time was okay with maintaining a do not resuscitate.  She would desire that the patient be offered food respecting the patient's right to refuse pills and food as was important to the patient's daughter.  The patient's daughter will call me when the patient's brother gets into town in order to support her in her decisions and explain and answer any questions to the patient's  family.  Review of systems is unable to be obtained secondary to the patient's cognitive status.  PAST MEDICAL HISTORY: 1. Recurrent urinary tract infections with multiresistant organisms. 2. Acute and chronic renal failure. 3. Diabetes. 4. Atrial fibrillation. 5. Hypokalemia. 6. History of diastolic heart failure. 7. History of advanced dementia. 8. History of constipation. 9. History of colitis. 10.History of coronary artery disease. 11.History of GERD. 12.History of hypertension. 13.Hyperlipidemia. 14.Urinary incontinence.  PAST SURGICAL HISTORY: 1. Humeral head and subcapital fracture repair in 2007. 2. Stents in 2008, status post MI and a pacemaker in 2008.  ALLERGIES:  Her allergies are to LIPITOR and PENICILLIN.  Her medications were reviewed.  At this time, the in-hospital medications have been discontinued as the patient is not taking her meds.  SOCIAL HISTORY:  The patient grew up in [place].  She does not smoke or drink to the best that I can ascertain from her family and old records. She has 1 daughter.  I am unable to obtain her family history from her at this time.  PHYSICAL EXAMINATION:  VITAL SIGNS:  Temperature is 97.4, blood pressure 121/81, pulse 63, respirations 19, saturation 98%. PPS 40-50% Fast  Score:6.5  GENERAL:  This is an elderly, cachectic, African American female in mild distress secondary to advanced dementia and urinary tract infection. She at times is difficult to reorient, but most of the time can be settled down with some tender loving care. HEENT:  She is normocephalic, atraumatic.  Pupils equal, round, and reactive to light.  Extraocular muscles appeared to be intact.  She has a very poor dentition. NECK:  Supple.  There is no JVD, no lymph nodes, no carotid bruits. CHEST:  Decreased but clear to auscultation.  There is no rhonchi, rales, or wheezes.  She does have an anterior chest wall pacemaker. CARDIOVASCULAR:  Regular rate  and rhythm.  Positive S1 and S2.  No S3 or S4.  No murmurs, rubs, or gallops. ABDOMEN:  Soft, nontender, nondistended with positive bowel sounds.  No hepatosplenomegaly. EXTREMITIES:  Very cachectic and frail.  They are warm, however, without any mottling. NEUROLOGIC:  The patient is not oriented to herself, time, or place.  I observed her while she was eating and she will perseverate and just continue to chew food indefinitely.  She does have some ability to move soft applesauce to the back of her throat and swallow that, but in general, her mouth needs to be cleared out or she will spit it out herself.  PERTINENT LABORATORIES:  Her RPR is 124 which is abnormal.  Her RPR is reactive.  Ferritin is 356.  B12 is 626.  White count is 5.1, hemoglobin 10, hematocrit 30.2, and platelets of 101.  Her sodium is 148, potassium 3.0, chloride 114, CO2 23, BUN is 50, and creatinine 3.02.  Liver enzymes are within normal limits except the AST which is 40.  Her is albumin is 2.4.  MRSA screen was negative.  Urine culture from July 03, 2011, had greater than 100,000 units suggesting a contaminant.  Cardiac markers have been negative.  Previous urine culture on June 26, 2011, had yeast.  ASSESSMENT AND PLAN:  [name] with advanced dementia who is now unable to eat because she does not understand the significance of food.  She will continue to chew and pocket and spit the food out.  She was admitted to the hospital with recurrent urinary tract infection.  Course has been complicated by agitation and finding of a positive RPR.  The patient's daughter did goals of care with Dr. Noel Gerold and made some choices that at this time she is leaving in place but needed to speak with her uncle about just to make sure that he understands the severity of mom's illness.  The patient obviously wants to feel that she is treating her mom appropriately, and so at this time, her only  requests are to maintain a do not resuscitate to offer food, but respect her mom's desire not to eat and to offer her medications understanding that she may refuse.  The daughter is okay with her transition into the palliative care unit, and I offered support for when her uncle comes into town to speak about the choices that she has made.  The following outlined goals of care as we know them. 1. Do not resuscitate, do not intubate, comfort measures outlined as     follows. 2. Urinary tract infection.  The daughter would like to try to keep     oral antibiotics but understands that the patient may not allow her     to have them.  I will investigate in the a.m.  with Dr. Noel Gerold, what     his thoughts are regarding the positive RPR and the continuation of     antibiotics since the urine culture is negative.  We will attempt     to give her medications and applesauce if that is indeed the case     that she is able to take them and accept them. 3. Transfer to the palliative care unit. 4. Acute renal failure.  The patient will be offered food and water,     but her daughter understands that she may refuse and we will     respect her right to refuse.  For comfort, we can paint her tongue     with flavors.  Revise inability to eat due to advanced dementia, we     will paint the tongue with flavors for comfort and daughter does     not desire a PEG tube at this time. 5. Positive RPR noted after my initial evaluation.  I will contact Dr.     Noel Gerold to determine where to proceed next with this finding. 6. Dementia with agitation.  We will use risperidone and Ativan     sublingual and stop the IV/IM Thorazine which may cause pain and     worsening agitation.  Total time on this patient was from 5:00 p.m. to 5:30 p.m. and then subsequently 7:50 to 8:53 p.m.  Greater than 50% of this time was spent counseling and coordinating care.     Jamoni Hewes L. Ladona [name], MD     MLT/MEDQ  D:  07/07/2011  T:   07/07/2011  Job:  [id number]  cc:   Dr. Noel Gerold  Electronically Signed by Derenda Mis MD on 07/08/2011 07:29:36 AM

## 2011-07-24 ENCOUNTER — Encounter: Payer: Medicare Other | Admitting: Gastroenterology

## 2011-07-24 ENCOUNTER — Ambulatory Visit (HOSPITAL_COMMUNITY): Admission: RE | Admit: 2011-07-24 | Payer: Medicare Other | Source: Ambulatory Visit | Admitting: Gastroenterology

## 2011-07-26 DEATH — deceased

## 2011-07-27 NOTE — Discharge Summary (Signed)
NAMEJANA, SWARTZLANDER NO.:  [id number]  MEDICAL RECORD NO.:  [id number]  LOCATION:  4524                         FACILITY:  [facility]  PHYSICIAN:  Gordy Savers, MDDATE OF BIRTH:  1925-11-30  DATE OF ADMISSION:  07/04/2011 DATE OF DISCHARGE:  07/10/2011                              DISCHARGE SUMMARY   FINAL DIAGNOSES: 1. Advanced dementia. 2. Acute mental status changes. 3. Severe dehydration with acute renal failure. 4. Positive RPR. 5. Increase liver function studies. 6. Type 2 diabetes. 7. Hypertension. 8. History of atrial fibrillation.  DISCHARGE MEDICATIONS:  None.  HISTORY OF PRESENT ILLNESS:  The patient is an 77 year old female with a history of UTIs and acute renal failure in the past.  She presented to the hospital with increasing weakness, confusion and was noted to have UTI and acute renal failure.  She was also noted be hyponatremic.  PAST MEDICAL HISTORY:  Pertinent for a prior history of toxic metabolic encephalopathy and a history of recurrent UTIs.  She has had admissions for dehydration, acute renal failure in the past.  She has type 2 diabetes, history of atrial fibrillation and history of chronic diastolic heart failure.  She has chronic dementia, constipation and remote history of coronary artery disease.  She has history also of hypertension and dyslipidemia.  There has been history of urinary incontinence for which a Foley catheter has been used intermittently in the past.  PAST SURGICAL HISTORY:  Pertinent for prior hip fracture.  She has coronary artery disease and status post stenting in 2008.  She has had a pacemaker inserted in 2008 also.  ALLERGIES:  LIPITOR and PENICILLIN.  MEDICATIONS PRIOR TO ADMISSION: 1. Amiodarone. 2. Daily aspirin. 3. Cardizem. 4. Glipizide. 5. Labetalol. 6. Lasix.  LABORATORY DATA AND HOSPITAL COURSE:  The patient was admitted to the hospital with acute renal failure and the patient  was carefully rehydrated.  She was noted to have urinary tract infection for which she received initial antibiotic treatment for.  Her Lasix was discontinued on admission.  Initially, amiodarone and Cardizem were continued. During the hospital, the patient is also noted to have a positive RPR. A subsequent treponema pallidum antibody was quite elevated at 8.0.  The implications of the positive RPR were discussed with the patient's daughter.  It was felt that further investigation for possible neuro symptoms which would require a lumbar puncture and also penicillin desensitization as well as prolonged antibiotic therapy.  It was felt this was very unlikely to change her clinical course in her advanced dementia.  The situation was discussed with Infectious Disease.  The patient was treated for her metabolic disturbances with improvement of her electrolyte abnormalities and prerenal azotemia.  Her liver function studies improved.  However, the patient continued a progressive downhill course with dementia and intermittent agitation.  She was initially treated with Risperdal that was later tapered and discontinued during the hospital.  A palliative medical consult was obtained and she was transferred to 4500.  The family requested comfort measures only and her multiple medications were discontinued.  Her DNR status was honored.  DISPOSITION:  The patient will be discharged pending the availability of a skilled nursing home bed.  She will be discharged for comfort measures only and will be taken no oral medications.  At the present time, her oral intake is nonexistent.  PROGNOSIS:  Terminal.  CONDITION ON DISCHARGE:  Poor.     Gordy Savers, MD     PFK/MEDQ  D:  07/09/2011  T:  07/09/2011  Job:  [id number]  Electronically Signed by Eleonore Chiquito MD on 07/27/2011 12:58:54 PM

## 2011-08-12 NOTE — H&P (Signed)
NAMECHIANTI, GOH NO.:  [id number]  MEDICAL RECORD NO.:  [id number]  LOCATION:  WLED                         FACILITY:  [facility]  PHYSICIAN:  Massie Maroon, MD        DATE OF BIRTH:  Dec 03, 1925  DATE OF ADMISSION:  07/03/2011 DATE OF DISCHARGE:                             HISTORY & PHYSICAL   CHIEF COMPLAINT:  UTI, acute renal failure.  HISTORY OF PRESENT ILLNESS:  This is an 77 year old female with history of recurrent UTI in the past, acute renal failure in the past, presents with UTI and acute renal failure.  She was originally brought over to the emergency room because she complained of right hip pain and was not eating and had increasing weakness.  She was then found to have UTI and acute renal failure as above.  The patient is not on any NSAIDs.  She was noted to be hyponatremic. Her urine did not show any evidence of casts, ATN.  Urine studies are still pending.  The patient will be transferred to [facility] for further evaluation since they have dialysis capability in case her renal function does not correct with hydration.  I am hopeful that will, however.  PAST MEDICAL HISTORY: 1. Toxic metabolic encephalopathy. 2. Multidrug resistant UTI. 3. Dehydration. 4. Acute renal failure. 5. Diabetes type 2. 6. Atrial fibrillation. 7. Hypokalemia. 8. History of chronic diastolic heart failure. 9. Dementia. 10.Constipation. 11.History of colitis. 12.History of CAD. 13.GERD. 14.Hypertension. 15.Hyperlipidemia. 16.Urinary incontinence, chronic Foley catheter.  PAST SURGICAL HISTORY:  Humeral head and subcapital fracture repair in 2007, stent placement in 2008 secondary to MI status post pacemaker placement in 2008.  ALLERGIES:  LIPITOR, PENICILLIN.  MEDICATIONS: 1. Amiodarone 200 mg p.o. daily. 2. Enteric-coated aspirin 81 mg p.o. 3. Cardizem 360 mg p.o. daily. 4. Glipizide 2.5 mg p.o. daily. 5. Labetalol 200 mg p.o. t.i.d. 6. Lasix  40 mg p.o. daily.  REVIEW OF SYSTEMS:  Negative for all 10 organ systems except for pertinent positives stated above.  PHYSICAL EXAMINATION:  VITAL SIGNS:  Temperature 99.6, pulse 70, blood pressure 162/104, respiratory rate 18, pulse ox 99% on room air. HEENT:  Anicteric. NECK:  No JVD. HEART:  Regular rate and rhythm.  S1, S2. LUNGS:  CTAP. ABDOMEN:  Soft, nontender, nondistended.  Positive bowel sounds. EXTREMITIES:  No cyanosis, clubbing or edema. SKIN:  No rashes. LYMPH NODES:  No adenopathy. NEURO EXAM:  Nonfocal. MUSCULOSKELETAL:  Onychomycosis.  LABORATORY DATA:  Sodium 155, potassium 4.0, BUN 78, creatinine 5.41, AST 48, ALT 24, albumin 3.1, calcium 10.0.  WBC 7.3, hemoglobin 11.3, platelet count 133.  Urinalysis, WBCs too numerous to count, RBCs 11 to 20.  ASSESSMENT/PLAN: 1. Acute renal failure.  Check urine sodium, urine creatinine, urine     eosinophils.  Check an abdominal ultrasound. 2. Abnormal liver function tests.  Check an abdominal ultrasound. 3. Hyponatremia:  Likely secondary to dehydration.  Hydrate     aggressively with one-half normal saline at 125 mL per hour x 4 L. 4. Hypertension, uncontrolled.  Hydralazine 10 mg IV q.6 h p.r.n.     systolic blood pressure greater than 160. 5. Atrial fibrillation.  Continue amiodarone.  Continue Cardizem.     Continue aspirin.  Continue labetalol. 6. Congestive heart failure:  Discontinue Lasix for now. 7. Type 2 diabetes:  Fingerstick blood sugars a.c. and h.s. NovoLog     sensitive sliding scale.  DC glipizide.     Massie Maroon, MD     JYK/MEDQ  D:  07/03/2011  T:  07/03/2011  Job:  [id number]  Electronically Signed by Pearson Grippe MD on 08/12/2011 02:29:08 PM

## 2011-09-28 LAB — DIFFERENTIAL
Eosinophils Absolute: 0.1 10*3/uL (ref 0.0–0.7)
Lymphocytes Relative: 29 % (ref 12–46)
Lymphs Abs: 1.3 10*3/uL (ref 0.7–4.0)
Monocytes Relative: 9 % (ref 3–12)
Neutro Abs: 2.6 10*3/uL (ref 1.7–7.7)
Neutrophils Relative %: 60 % (ref 43–77)

## 2011-09-28 LAB — GLUCOSE, CAPILLARY: Glucose-Capillary: 135 mg/dL — ABNORMAL HIGH (ref 70–99)

## 2011-09-28 LAB — CBC
MCV: 89.3 fL (ref 78.0–100.0)
RBC: 3.96 MIL/uL (ref 3.87–5.11)
WBC: 4.4 10*3/uL (ref 4.0–10.5)

## 2011-09-28 LAB — BASIC METABOLIC PANEL
Chloride: 111 mEq/L (ref 96–112)
Creatinine, Ser: 1.45 mg/dL — ABNORMAL HIGH (ref 0.4–1.2)
GFR calc Af Amer: 42 mL/min — ABNORMAL LOW (ref >60)
GFR calc non Af Amer: 35 mL/min — ABNORMAL LOW (ref >60)

## 2012-07-16 IMAGING — CT CT HEAD W/O CM
1 of 3 series · 16 of 30 positions shown, 20 images · non-contrast
Comparison: Head CT scan 04/10/2004.

CLINICAL DATA: Confusion.

CT HEAD WITHOUT CONTRAST
TECHNIQUE: Contiguous axial images were obtained from the base of
the skull through the vertex without contrast.

[Series 2: head_seq 4.5 h37s st · axial · 0.43mm/px · z∈[+1284,+1410]mm · 16 of 32 slices shown, 20 images]
[im 2/32  brain]
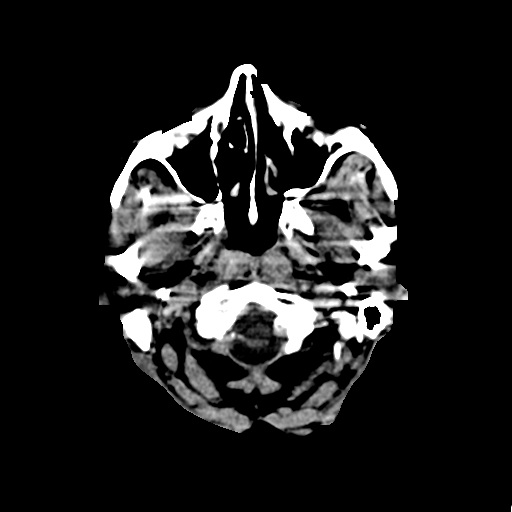
[im 2/32  bone]
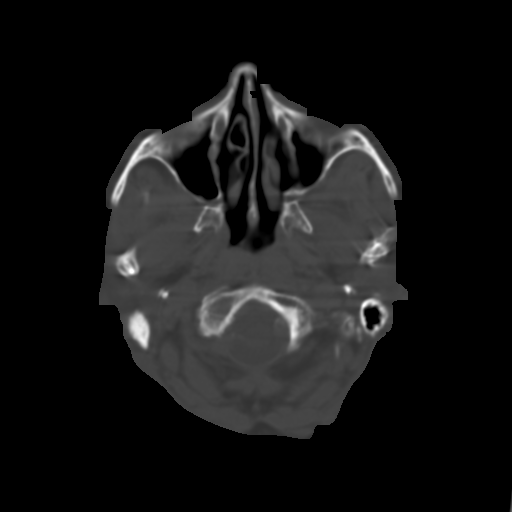
[im 4/32  brain]
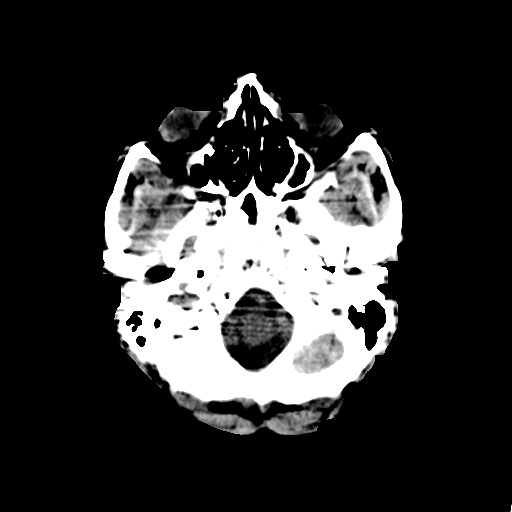
[im 5/32  brain]
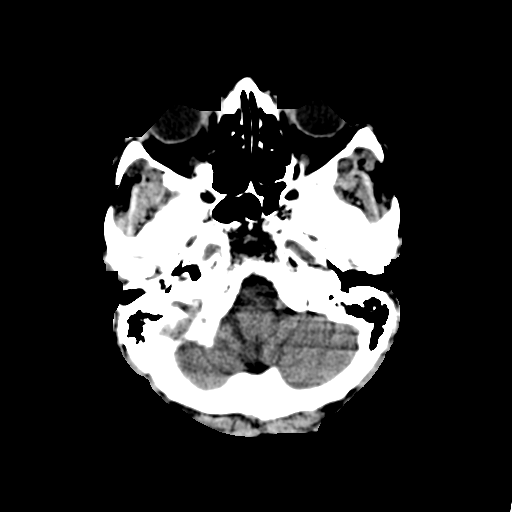
[im 7/32  brain]
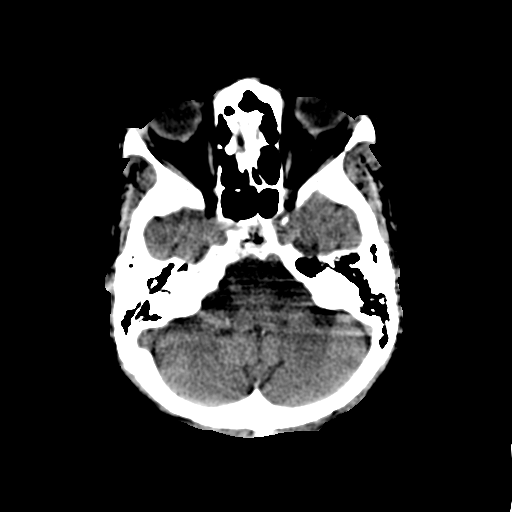
[im 10/32  brain]
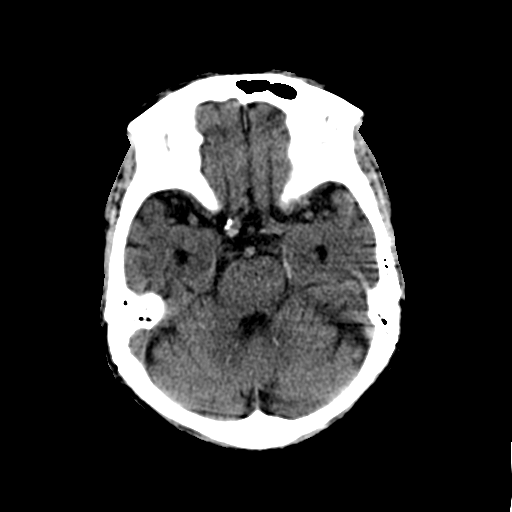
[im 10/32  bone]
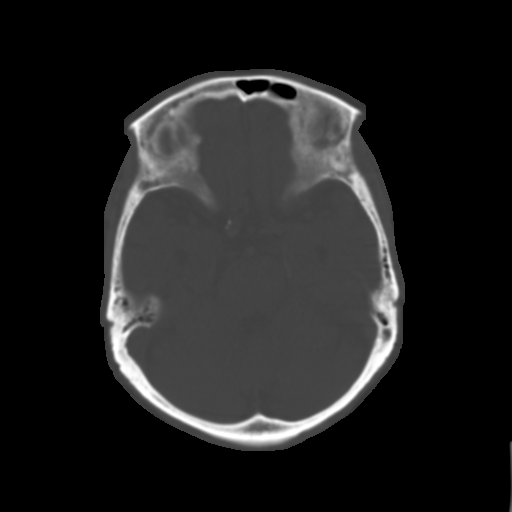
[im 12/32  brain]
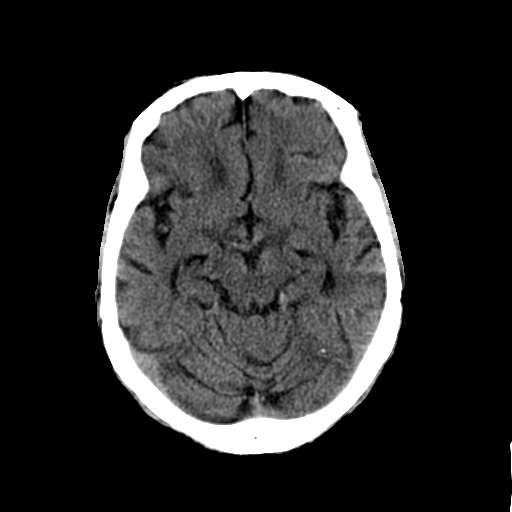
[im 14/32  brain]
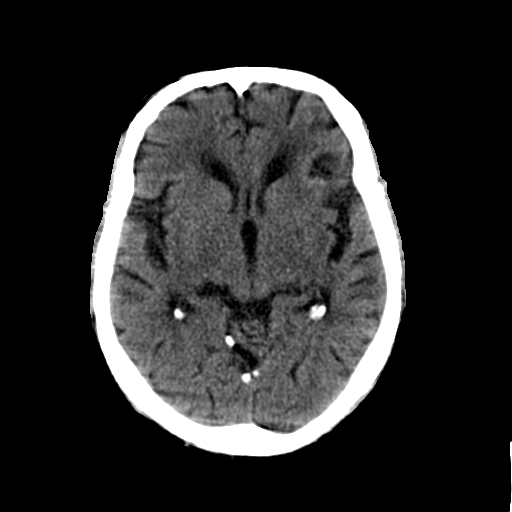
[im 15/32  brain]
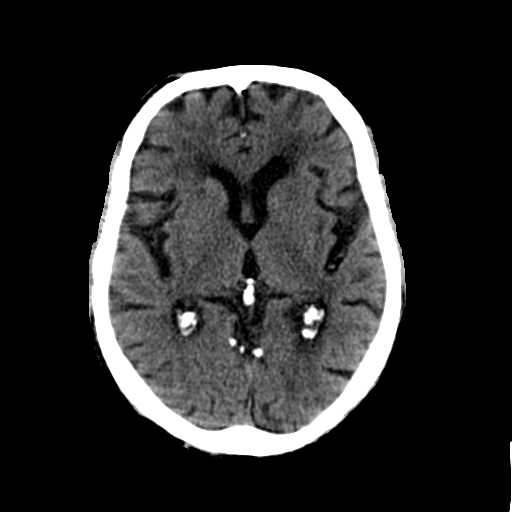
[im 17/32  brain]
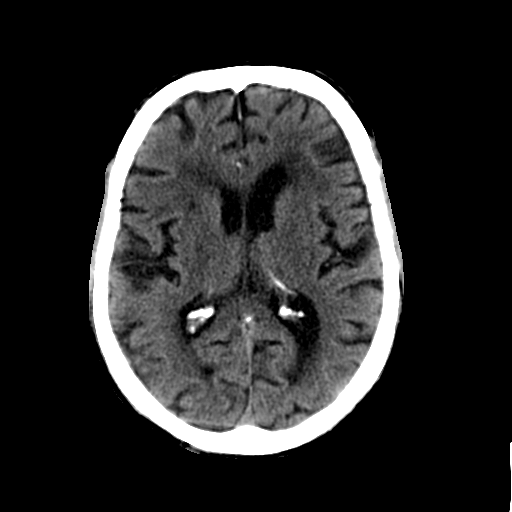
[im 17/32  bone]
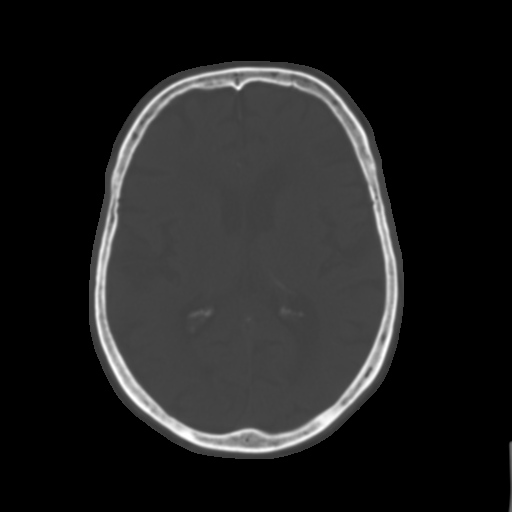
[im 18/32  brain]
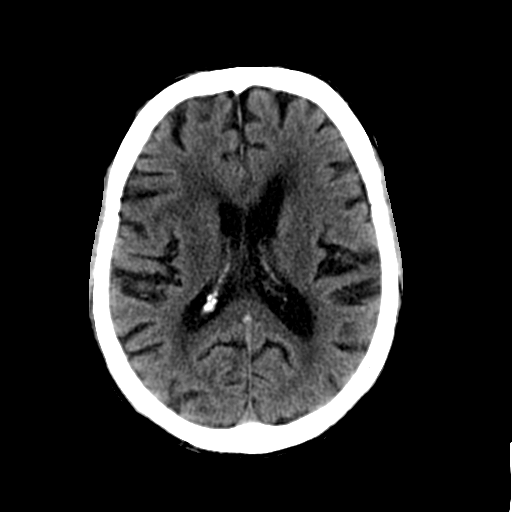
[im 20/32  brain]
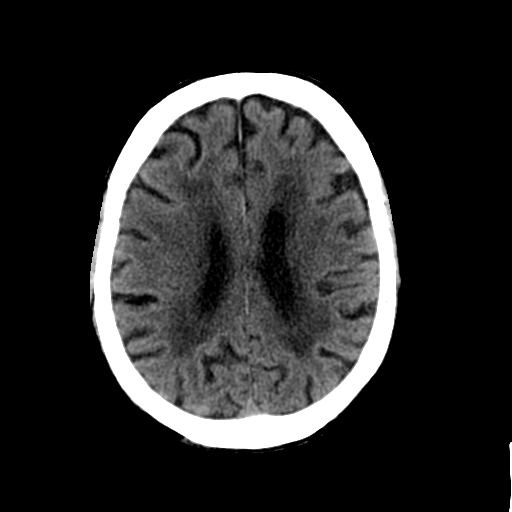
[im 22/32  brain]
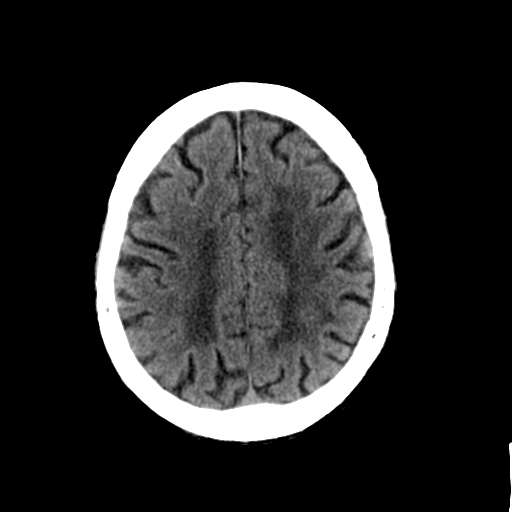
[im 25/32  brain]
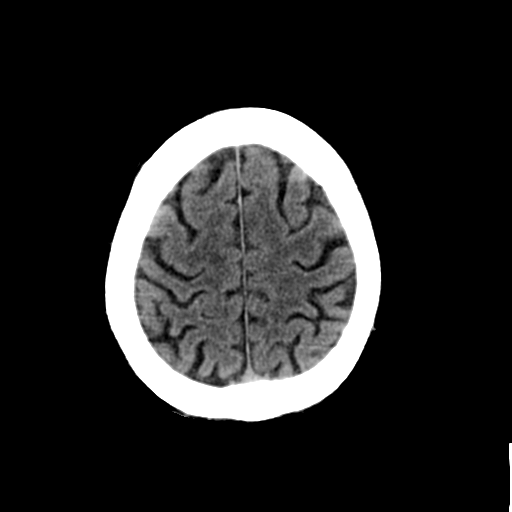
[im 25/32  bone]
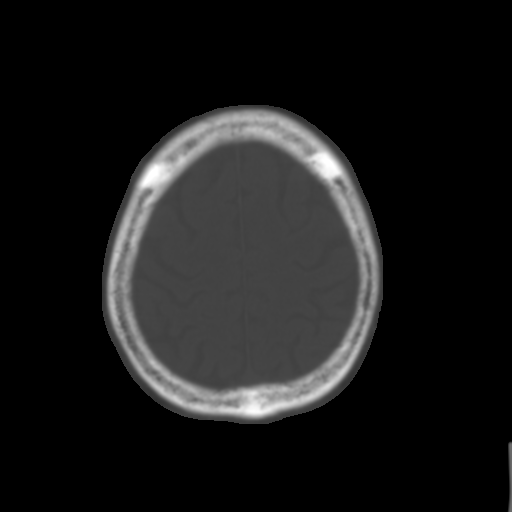
[im 27/32  brain]
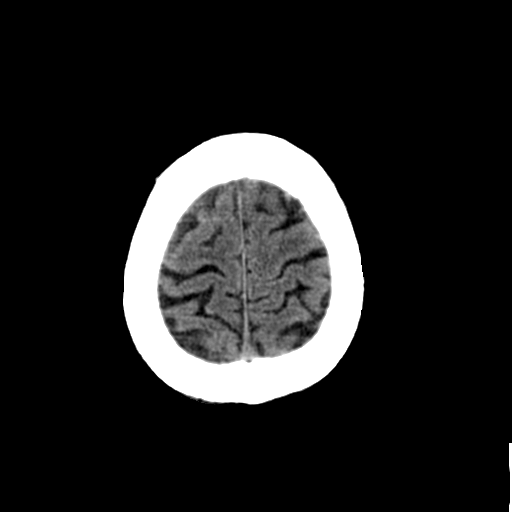
[im 28/32  brain]
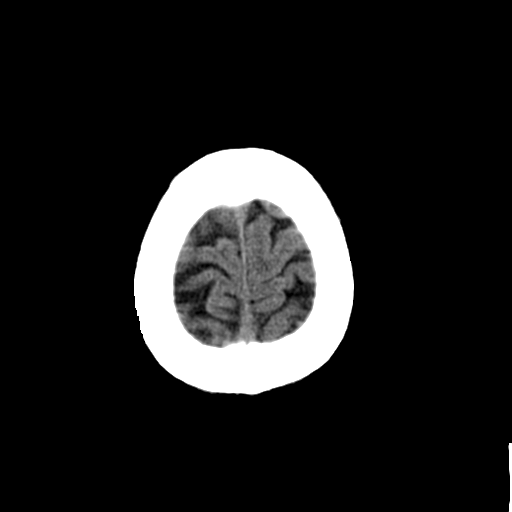
[im 30/32  brain]
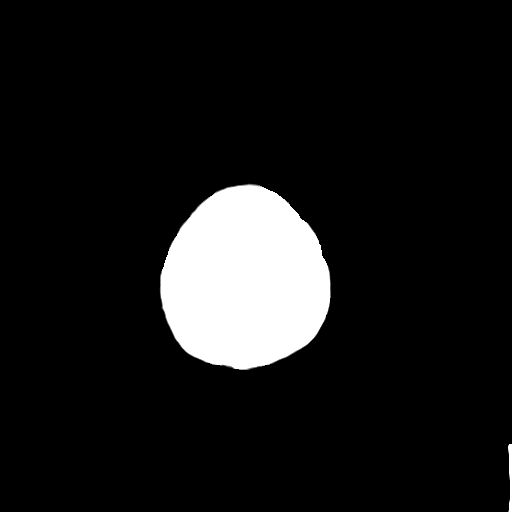

[16 of 30 positions shown; findings below may reference images not displayed]

FINDINGS: The patient has fairly extensive hypoattenuation in the
subcortical and periventricular deep white matter.  There is no
evidence of acute intracranial matter including acute infarction,
hemorrhage, mass lesion, mass effect, midline shift or abnormal
extra-axial fluid collection.  No hydrocephalus.  Calvarium intact.
IMPRESSION: No acute finding.
